# Patient Record
Sex: Female | Born: 1945 | Race: White | Hispanic: No | Smoking: Former smoker
Health system: Southern US, Community
[De-identification: ages and names within clinical notes are randomized; demographics above are authoritative.]

## PROBLEM LIST (undated history)

## (undated) DIAGNOSIS — G4730 Sleep apnea, unspecified: Secondary | ICD-10-CM

## (undated) DIAGNOSIS — M1990 Unspecified osteoarthritis, unspecified site: Secondary | ICD-10-CM

## (undated) DIAGNOSIS — K589 Irritable bowel syndrome without diarrhea: Secondary | ICD-10-CM

## (undated) DIAGNOSIS — M797 Fibromyalgia: Secondary | ICD-10-CM

## (undated) DIAGNOSIS — Z9889 Other specified postprocedural states: Secondary | ICD-10-CM

## (undated) DIAGNOSIS — T4145XA Adverse effect of unspecified anesthetic, initial encounter: Secondary | ICD-10-CM

## (undated) DIAGNOSIS — R112 Nausea with vomiting, unspecified: Secondary | ICD-10-CM

## (undated) DIAGNOSIS — M810 Age-related osteoporosis without current pathological fracture: Secondary | ICD-10-CM

## (undated) DIAGNOSIS — I10 Essential (primary) hypertension: Secondary | ICD-10-CM

## (undated) DIAGNOSIS — H269 Unspecified cataract: Secondary | ICD-10-CM

## (undated) DIAGNOSIS — K219 Gastro-esophageal reflux disease without esophagitis: Secondary | ICD-10-CM

## (undated) DIAGNOSIS — T8859XA Other complications of anesthesia, initial encounter: Secondary | ICD-10-CM

## (undated) HISTORY — DX: Irritable bowel syndrome, unspecified: K58.9

## (undated) HISTORY — DX: Fibromyalgia: M79.7

## (undated) HISTORY — DX: Unspecified cataract: H26.9

## (undated) HISTORY — DX: Gastro-esophageal reflux disease without esophagitis: K21.9

## (undated) HISTORY — PX: ABDOMINAL HYSTERECTOMY: SHX81

## (undated) HISTORY — PX: CARPAL TUNNEL RELEASE: SHX101

## (undated) HISTORY — PX: KNEE ARTHROSCOPY: SUR90

## (undated) HISTORY — DX: Age-related osteoporosis without current pathological fracture: M81.0

## (undated) HISTORY — PX: PLANTAR FASCIA SURGERY: SHX746

## (undated) HISTORY — PX: ROTATOR CUFF REPAIR: SHX139

## (undated) HISTORY — DX: Unspecified osteoarthritis, unspecified site: M19.90

## (undated) HISTORY — PX: REPLACEMENT TOTAL KNEE: SUR1224

---

## 2013-04-16 DIAGNOSIS — G4710 Hypersomnia, unspecified: Secondary | ICD-10-CM | POA: Diagnosis not present

## 2013-04-16 DIAGNOSIS — R5383 Other fatigue: Secondary | ICD-10-CM | POA: Diagnosis not present

## 2013-04-16 DIAGNOSIS — R5381 Other malaise: Secondary | ICD-10-CM | POA: Diagnosis not present

## 2013-04-21 DIAGNOSIS — G4730 Sleep apnea, unspecified: Secondary | ICD-10-CM | POA: Diagnosis not present

## 2013-04-21 DIAGNOSIS — G4710 Hypersomnia, unspecified: Secondary | ICD-10-CM | POA: Diagnosis not present

## 2013-04-22 DIAGNOSIS — I10 Essential (primary) hypertension: Secondary | ICD-10-CM | POA: Diagnosis not present

## 2013-04-22 DIAGNOSIS — I2510 Atherosclerotic heart disease of native coronary artery without angina pectoris: Secondary | ICD-10-CM | POA: Diagnosis not present

## 2013-04-22 DIAGNOSIS — IMO0001 Reserved for inherently not codable concepts without codable children: Secondary | ICD-10-CM | POA: Diagnosis not present

## 2013-04-22 DIAGNOSIS — E559 Vitamin D deficiency, unspecified: Secondary | ICD-10-CM | POA: Diagnosis not present

## 2013-04-22 DIAGNOSIS — E782 Mixed hyperlipidemia: Secondary | ICD-10-CM | POA: Diagnosis not present

## 2013-04-22 DIAGNOSIS — E119 Type 2 diabetes mellitus without complications: Secondary | ICD-10-CM | POA: Diagnosis not present

## 2013-04-22 DIAGNOSIS — E039 Hypothyroidism, unspecified: Secondary | ICD-10-CM | POA: Diagnosis not present

## 2013-04-22 DIAGNOSIS — S335XXA Sprain of ligaments of lumbar spine, initial encounter: Secondary | ICD-10-CM | POA: Diagnosis not present

## 2013-04-27 DIAGNOSIS — G4710 Hypersomnia, unspecified: Secondary | ICD-10-CM | POA: Diagnosis not present

## 2013-04-27 DIAGNOSIS — R5381 Other malaise: Secondary | ICD-10-CM | POA: Diagnosis not present

## 2013-04-27 DIAGNOSIS — G4730 Sleep apnea, unspecified: Secondary | ICD-10-CM | POA: Diagnosis not present

## 2013-05-28 DIAGNOSIS — E669 Obesity, unspecified: Secondary | ICD-10-CM | POA: Diagnosis not present

## 2013-05-28 DIAGNOSIS — I10 Essential (primary) hypertension: Secondary | ICD-10-CM | POA: Diagnosis not present

## 2013-05-28 DIAGNOSIS — IMO0001 Reserved for inherently not codable concepts without codable children: Secondary | ICD-10-CM | POA: Diagnosis not present

## 2013-06-15 DIAGNOSIS — G4710 Hypersomnia, unspecified: Secondary | ICD-10-CM | POA: Diagnosis not present

## 2013-06-15 DIAGNOSIS — R5383 Other fatigue: Secondary | ICD-10-CM | POA: Diagnosis not present

## 2013-06-15 DIAGNOSIS — R5381 Other malaise: Secondary | ICD-10-CM | POA: Diagnosis not present

## 2013-06-22 DIAGNOSIS — Z1231 Encounter for screening mammogram for malignant neoplasm of breast: Secondary | ICD-10-CM | POA: Diagnosis not present

## 2013-06-25 DIAGNOSIS — E669 Obesity, unspecified: Secondary | ICD-10-CM | POA: Diagnosis not present

## 2013-06-25 DIAGNOSIS — I10 Essential (primary) hypertension: Secondary | ICD-10-CM | POA: Diagnosis not present

## 2013-07-30 DIAGNOSIS — I10 Essential (primary) hypertension: Secondary | ICD-10-CM | POA: Diagnosis not present

## 2013-07-30 DIAGNOSIS — G4730 Sleep apnea, unspecified: Secondary | ICD-10-CM | POA: Diagnosis not present

## 2013-07-30 DIAGNOSIS — E669 Obesity, unspecified: Secondary | ICD-10-CM | POA: Diagnosis not present

## 2013-07-30 DIAGNOSIS — IMO0001 Reserved for inherently not codable concepts without codable children: Secondary | ICD-10-CM | POA: Diagnosis not present

## 2013-08-05 DIAGNOSIS — R5383 Other fatigue: Secondary | ICD-10-CM | POA: Diagnosis not present

## 2013-08-05 DIAGNOSIS — R5381 Other malaise: Secondary | ICD-10-CM | POA: Diagnosis not present

## 2013-08-05 DIAGNOSIS — G4710 Hypersomnia, unspecified: Secondary | ICD-10-CM | POA: Diagnosis not present

## 2013-08-05 DIAGNOSIS — G4730 Sleep apnea, unspecified: Secondary | ICD-10-CM | POA: Diagnosis not present

## 2013-08-27 DIAGNOSIS — I10 Essential (primary) hypertension: Secondary | ICD-10-CM | POA: Diagnosis not present

## 2013-08-27 DIAGNOSIS — E669 Obesity, unspecified: Secondary | ICD-10-CM | POA: Diagnosis not present

## 2013-08-27 DIAGNOSIS — IMO0001 Reserved for inherently not codable concepts without codable children: Secondary | ICD-10-CM | POA: Diagnosis not present

## 2013-08-27 DIAGNOSIS — S90569A Insect bite (nonvenomous), unspecified ankle, initial encounter: Secondary | ICD-10-CM | POA: Diagnosis not present

## 2013-08-27 DIAGNOSIS — W57XXXA Bitten or stung by nonvenomous insect and other nonvenomous arthropods, initial encounter: Secondary | ICD-10-CM | POA: Diagnosis not present

## 2013-10-19 DIAGNOSIS — F411 Generalized anxiety disorder: Secondary | ICD-10-CM | POA: Diagnosis not present

## 2013-11-23 DIAGNOSIS — R5381 Other malaise: Secondary | ICD-10-CM | POA: Diagnosis not present

## 2013-11-23 DIAGNOSIS — G4710 Hypersomnia, unspecified: Secondary | ICD-10-CM | POA: Diagnosis not present

## 2013-11-23 DIAGNOSIS — R5383 Other fatigue: Secondary | ICD-10-CM | POA: Diagnosis not present

## 2013-11-23 DIAGNOSIS — G4730 Sleep apnea, unspecified: Secondary | ICD-10-CM | POA: Diagnosis not present

## 2013-11-30 ENCOUNTER — Ambulatory Visit: Payer: Self-pay | Admitting: Podiatry

## 2013-12-03 ENCOUNTER — Ambulatory Visit (INDEPENDENT_AMBULATORY_CARE_PROVIDER_SITE_OTHER): Payer: Medicare Other

## 2013-12-03 VITALS — BP 152/81 | HR 94 | Resp 18

## 2013-12-03 DIAGNOSIS — M79609 Pain in unspecified limb: Secondary | ICD-10-CM

## 2013-12-03 DIAGNOSIS — IMO0001 Reserved for inherently not codable concepts without codable children: Secondary | ICD-10-CM

## 2013-12-03 DIAGNOSIS — G629 Polyneuropathy, unspecified: Secondary | ICD-10-CM

## 2013-12-03 DIAGNOSIS — B351 Tinea unguium: Secondary | ICD-10-CM

## 2013-12-03 DIAGNOSIS — M797 Fibromyalgia: Secondary | ICD-10-CM

## 2013-12-03 DIAGNOSIS — G609 Hereditary and idiopathic neuropathy, unspecified: Secondary | ICD-10-CM | POA: Diagnosis not present

## 2013-12-03 DIAGNOSIS — M79676 Pain in unspecified toe(s): Secondary | ICD-10-CM

## 2013-12-03 MED ORDER — TERBINAFINE HCL 250 MG PO TABS: 250.0000 mg | ORAL_TABLET | Freq: Every day | ORAL | Status: DC

## 2013-12-03 NOTE — Progress Notes (Signed)
   Subjective:    Patient ID: Alejandra Greene, female    DOB: Apr 16, 1945, 68 y.o.   MRN: [id number]  HPI I AM HAVING SOME TOENAIL ISSUES WITH MY TOES AND THERE ARE FUNGAL AND THE NAILS ARE LOOSE AND HAVE BEEN GOING ON FOR 2 YEARS AND THICK AND DISCOLORED AND MY FEET BURN AND THROBS AND CAN'T WEAR SHOES ALL DAY AND THEY DO GET NUMB AND TINGLE AND ARE SORE AND TENDER AND THEY DO GET COLD    Review of Systems  Constitutional: Positive for fatigue.  HENT: Positive for hearing loss.   Cardiovascular:       SWELLING   Musculoskeletal:       MUSCLE PAIN  Neurological: Positive for headaches.  Hematological: Bruises/bleeds easily.  All other systems reviewed and are negative.      Objective:   Physical Exam 68 year old white female well-developed well-nourished oriented x3 presents at this time with vital signs stable does have a long-standing history of burning stinging abnormal sensation her foot as well as fungus and thick deformed dystrophic nails more than 2 year history. B. burns and throbs he can't wear shoes. Does have history of fibromyalgia and peripheral neuropathy which is contributing to some of them symptomology R. does have concern about fungus the nails.  Lower extremity objective as follows vascular status is intact pedal pulses palpable DP pulse two over four bilateral PT one over 4 bilateral mild varicosities noted capillary refill time 3 seconds. Epicritic and proprioceptive sensations intact and symmetric bilateral normal plantar response and DTRs not elicited. 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 Current taking  Tranxene for that no other medications are being taken does have a history of taking Lyrica but could not tolerate that medication.         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 causing some of pruritus plan at this time nails are debrided 1 through 5 bilateral nail clippings are submitted from a deep debridement of nails for fungal culture and KOH patient is placed on a regimen of Lamisil therapy Lamisil 250 mg tablet circumventing all one daily for 90 days we'll do lab criptotic functions after the first 30 days to rule out any potential side effects and risk discontinue the [place] other symptoms or side effects were to occur we'll recheck in 3 months for followup and nail check and future palliative care patient request nails to be debridement this time as they're painful tender and symptomatic patient understands that treating those fungus will not resolve all the abnormal sensations and some of this may be neurologic or connective tissue disorder secondary to fibromyalgia. Recheck in 3 months  Harriet Masson DPM

## 2013-12-03 NOTE — Patient Instructions (Signed)
Onychomycosis/Fungal Toenails  WHAT IS IT? An infection that lies within the keratin of your nail plate that is caused by a fungus.  WHY ME? Fungal infections affect all ages, sexes, races, and creeds.  There may be many factors that predispose you to a fungal infection such as age, coexisting medical conditions such as diabetes, or an autoimmune disease; stress, medications, fatigue, genetics, etc.  Bottom line: fungus thrives in a warm, moist environment and your shoes offer such a location.  IS IT CONTAGIOUS? Theoretically, yes.  You do not want to share shoes, nail clippers or files with someone who has fungal toenails.  Walking around barefoot in the same room or sleeping in the same bed is unlikely to transfer the organism.  It is important to realize, however, that fungus can spread easily from one nail to the next on the same foot.  HOW DO WE TREAT THIS?  There are several ways to treat this condition.  Treatment may depend on many factors such as age, medications, pregnancy, liver and kidney conditions, etc.  It is best to ask your doctor which options are available to you.  1. No treatment.   Unlike many other medical concerns, you can live with this condition.  However for many people this can be a painful condition and may lead to ingrown toenails or a bacterial infection.  It is recommended that you keep the nails cut short to help reduce the amount of fungal nail. 2. Topical treatment.  These range from herbal remedies to prescription strength nail lacquers.  About 40-50% effective, topicals require twice daily application for approximately 9 to 12 months or until an entirely new nail has grown out.  The most effective topicals are medical grade medications available through physicians offices. 3. Oral antifungal medications.  With an 80-90% cure rate, the most common oral medication requires 3 to 4 months of therapy and stays in your system for a year as the new nail grows out.  Oral  antifungal medications do require blood work to make sure it is a safe drug for you.  A liver function panel will be performed prior to starting the medication and after the first month of treatment.  It is important to have the blood work performed to avoid any harmful side effects.  In general, this medication safe but blood work is required. 4. Laser Therapy.  This treatment is performed by applying a specialized laser to the affected nail plate.  This therapy is noninvasive, fast, and non-painful.  It is not covered by insurance and is therefore, out of pocket.  The results have been very good with a 80-95% cure rate.  [facility] is the only practice in the area to offer this therapy. 5. Permanent Nail Avulsion.  Removing the entire nail so that a new nail will not grow back.  Fill the prescription for terbinafine 250 mg tablets, generic for Lamisil. Take 2 tablets one daily for the next 90 days. Your prescriptions for 30 days with 2 refills. Discontinue if any side effects GI irritation stomach upset rash or any other symptoms cause difficulties. Next  Will need to have hepatic function/liver testing done at the end of the first month of treatment prior to refilling the second month. [facility] is dispensed for hepatic function panel to be done 1 month from now

## 2013-12-07 DIAGNOSIS — E669 Obesity, unspecified: Secondary | ICD-10-CM | POA: Diagnosis not present

## 2013-12-07 DIAGNOSIS — I10 Essential (primary) hypertension: Secondary | ICD-10-CM | POA: Diagnosis not present

## 2013-12-07 DIAGNOSIS — B351 Tinea unguium: Secondary | ICD-10-CM | POA: Diagnosis not present

## 2013-12-07 DIAGNOSIS — G4730 Sleep apnea, unspecified: Secondary | ICD-10-CM | POA: Diagnosis not present

## 2013-12-07 DIAGNOSIS — K769 Liver disease, unspecified: Secondary | ICD-10-CM | POA: Diagnosis not present

## 2013-12-07 NOTE — Addendum Note (Signed)
Addended by: Cranford Mon R on: 12/07/2013 02:22 PM   Modules accepted: Orders

## 2014-01-11 DIAGNOSIS — E559 Vitamin D deficiency, unspecified: Secondary | ICD-10-CM | POA: Diagnosis not present

## 2014-01-11 DIAGNOSIS — Z23 Encounter for immunization: Secondary | ICD-10-CM | POA: Diagnosis not present

## 2014-01-11 DIAGNOSIS — G4733 Obstructive sleep apnea (adult) (pediatric): Secondary | ICD-10-CM | POA: Diagnosis not present

## 2014-01-11 DIAGNOSIS — M797 Fibromyalgia: Secondary | ICD-10-CM | POA: Diagnosis not present

## 2014-01-11 DIAGNOSIS — I10 Essential (primary) hypertension: Secondary | ICD-10-CM | POA: Diagnosis not present

## 2014-01-11 DIAGNOSIS — E669 Obesity, unspecified: Secondary | ICD-10-CM | POA: Diagnosis not present

## 2014-02-10 DIAGNOSIS — M4803 Spinal stenosis, cervicothoracic region: Secondary | ICD-10-CM | POA: Diagnosis not present

## 2014-02-10 DIAGNOSIS — M159 Polyosteoarthritis, unspecified: Secondary | ICD-10-CM | POA: Diagnosis not present

## 2014-02-10 DIAGNOSIS — M25511 Pain in right shoulder: Secondary | ICD-10-CM | POA: Diagnosis not present

## 2014-02-10 DIAGNOSIS — I10 Essential (primary) hypertension: Secondary | ICD-10-CM | POA: Diagnosis not present

## 2014-02-10 DIAGNOSIS — Z23 Encounter for immunization: Secondary | ICD-10-CM | POA: Diagnosis not present

## 2014-02-10 DIAGNOSIS — M797 Fibromyalgia: Secondary | ICD-10-CM | POA: Diagnosis not present

## 2014-02-10 DIAGNOSIS — Z111 Encounter for screening for respiratory tuberculosis: Secondary | ICD-10-CM | POA: Diagnosis not present

## 2014-03-11 ENCOUNTER — Ambulatory Visit (INDEPENDENT_AMBULATORY_CARE_PROVIDER_SITE_OTHER): Payer: Medicare Other

## 2014-03-11 VITALS — BP 147/80 | HR 84 | Resp 12

## 2014-03-11 DIAGNOSIS — M79676 Pain in unspecified toe(s): Secondary | ICD-10-CM | POA: Diagnosis not present

## 2014-03-11 DIAGNOSIS — M797 Fibromyalgia: Secondary | ICD-10-CM

## 2014-03-11 DIAGNOSIS — B351 Tinea unguium: Secondary | ICD-10-CM

## 2014-03-11 DIAGNOSIS — G629 Polyneuropathy, unspecified: Secondary | ICD-10-CM | POA: Diagnosis not present

## 2014-03-11 NOTE — Patient Instructions (Signed)

## 2014-03-11 NOTE — Progress Notes (Signed)
   Subjective:    Patient ID: Alejandra Greene, female    DOB: [birth year], 68 y.o.   MRN: [id number]  HPI  ''TOENAILS ARE LOOKING A LITTLE BETTER.''  Review of Systems no new findings or systemic changes noted    Objective:   Physical Exam neurovascular status is intact and unchanged pedal pulses are palpable DP +2 PT plus one over 4 Refill time 3 seconds patient been applying her taking the topical slush or oral antifungal Lamisil as an her third month of taking at this time there is some proximal clearing RE starting to occur no pain no discomfort no other complications with the medication noted nails thick brittle [place] friable dystrophic and criptotic 1 through 5 bilateral although previous nail excision partial excision of the left hallux is noted since she nails 9. At this time no other new changes are noted no open wounds no ulcers no secondary infections      Assessment & Plan:  Assessment onychomycosis painful mycotic nails debrided 9 continue with complete the Lamisil regimen as one month left to complete contact us any change difficulties into the interim. Recheck in 3 months for long-term follow-up at 6 or 9 month interval may consider booster of Lamisil if not completely resolving however there is proximal clearing showing at this point of the nails which are thick brittle crumbly yellow friable reappointed 3 months for an mycotic nail care and debridement next  Harriet Masson DPM

## 2014-06-10 ENCOUNTER — Ambulatory Visit (INDEPENDENT_AMBULATORY_CARE_PROVIDER_SITE_OTHER): Payer: Medicare Other

## 2014-06-10 VITALS — BP 139/76 | HR 88 | Resp 18

## 2014-06-10 DIAGNOSIS — G629 Polyneuropathy, unspecified: Secondary | ICD-10-CM

## 2014-06-10 DIAGNOSIS — M797 Fibromyalgia: Secondary | ICD-10-CM

## 2014-06-10 DIAGNOSIS — M79676 Pain in unspecified toe(s): Secondary | ICD-10-CM

## 2014-06-10 DIAGNOSIS — B351 Tinea unguium: Secondary | ICD-10-CM

## 2014-06-10 NOTE — Progress Notes (Signed)
   Subjective:    Patient ID: Alejandra Greene, female    DOB: 04-01-1946, 69 y.o.   MRN: [id number]  HPI i am here to get my toenails trimmed up and check feet    Review of Systems no new findings or systemic changes noted     Objective:   Physical Exam Neurovascular status is unchanged pedal pulses DP +2 PT 1 over 4 bilateral capillary refill time 3 seconds. Patient does have history of fibromyalgia in pain symptoms has thick brittle crumbly dystrophic nails 1 through 5 bilateral with keratosis and incurvation. No open wounds no ulcers no secondary infection is noted       Assessment & Plan:  Assessment this time is onychomycosis with friable discolored painful nails 1 through 5 bilateral debrided at this time. Patient is completed her Lamisil rest regimen continue monitor over the next 3 or months as needed for palliative care  Harriet Masson DPM

## 2014-06-23 DIAGNOSIS — R5383 Other fatigue: Secondary | ICD-10-CM | POA: Diagnosis not present

## 2014-06-23 DIAGNOSIS — G4733 Obstructive sleep apnea (adult) (pediatric): Secondary | ICD-10-CM | POA: Diagnosis not present

## 2014-06-24 DIAGNOSIS — G4733 Obstructive sleep apnea (adult) (pediatric): Secondary | ICD-10-CM | POA: Diagnosis not present

## 2014-06-28 DIAGNOSIS — Z1231 Encounter for screening mammogram for malignant neoplasm of breast: Secondary | ICD-10-CM | POA: Diagnosis not present

## 2014-07-23 DIAGNOSIS — I10 Essential (primary) hypertension: Secondary | ICD-10-CM | POA: Diagnosis not present

## 2014-07-23 DIAGNOSIS — G629 Polyneuropathy, unspecified: Secondary | ICD-10-CM | POA: Diagnosis not present

## 2014-07-23 DIAGNOSIS — K219 Gastro-esophageal reflux disease without esophagitis: Secondary | ICD-10-CM | POA: Diagnosis not present

## 2014-07-23 DIAGNOSIS — M797 Fibromyalgia: Secondary | ICD-10-CM | POA: Diagnosis not present

## 2014-09-09 ENCOUNTER — Ambulatory Visit: Payer: Medicare Other

## 2014-09-16 DIAGNOSIS — K580 Irritable bowel syndrome with diarrhea: Secondary | ICD-10-CM | POA: Diagnosis not present

## 2014-09-16 DIAGNOSIS — Z1211 Encounter for screening for malignant neoplasm of colon: Secondary | ICD-10-CM | POA: Diagnosis not present

## 2014-09-16 DIAGNOSIS — Z800 Family history of malignant neoplasm of digestive organs: Secondary | ICD-10-CM | POA: Diagnosis not present

## 2014-09-20 ENCOUNTER — Ambulatory Visit (INDEPENDENT_AMBULATORY_CARE_PROVIDER_SITE_OTHER): Payer: Medicare Other | Admitting: Podiatry

## 2014-09-20 ENCOUNTER — Ambulatory Visit: Payer: Medicare Other | Admitting: Podiatrist

## 2014-09-20 ENCOUNTER — Encounter: Payer: Self-pay | Admitting: Podiatry

## 2014-09-20 VITALS — BP 122/65 | HR 87 | Resp 18

## 2014-09-20 DIAGNOSIS — G629 Polyneuropathy, unspecified: Secondary | ICD-10-CM

## 2014-09-20 DIAGNOSIS — B351 Tinea unguium: Secondary | ICD-10-CM | POA: Diagnosis not present

## 2014-09-20 DIAGNOSIS — M79676 Pain in unspecified toe(s): Secondary | ICD-10-CM

## 2014-09-20 NOTE — Progress Notes (Signed)
Patient ID: Alejandra Greene, female   DOB: [birth year], 69 y.o.   MRN: [id number] Complaint:  Visit Type: Patient returns to my office for continued preventative foot care services. Complaint: Patient states" my nails have grown long and thick and become painful to walk and wear shoes" . She presents for preventative foot care services. No changes to ROS  Podiatric Exam: Vascular: dorsalis pedis and posterior tibial pulses are palpable bilateral. Capillary return is immediate. Temperature gradient is WNL. Skin turgor WNL  Sensorium: Normal Semmes Weinstein monofilament test. Normal tactile sensation bilaterally. Nail Exam: Pt has thick disfigured discolored nails with subungual debris noted bilateral entire nail hallux through fifth toenails Ulcer Exam: There is no evidence of ulcer or pre-ulcerative changes or infection. Orthopedic Exam: Muscle tone and strength are WNL. No limitations in general ROM. No crepitus or effusions noted. Foot type and digits show no abnormalities. Bony prominences are unremarkable. Skin: No Porokeratosis. No infection or ulcers  Diagnosis:  Tinea unguium, Pain in right toe, pain in left toes  Treatment & Plan Procedures and Treatment: Consent by patient was obtained for treatment procedures. The patient understood the discussion of treatment and procedures well. All questions were answered thoroughly reviewed. Debridement of mycotic and hypertrophic toenails, 1 through 5 bilateral and clearing of subungual debris. No ulceration, no infection noted.  Return Visit-Office Procedure: Patient instructed to return to the office for a follow up visit 3 months for continued evaluation and treatment.

## 2014-10-21 DIAGNOSIS — D122 Benign neoplasm of ascending colon: Secondary | ICD-10-CM | POA: Diagnosis not present

## 2014-10-21 DIAGNOSIS — I10 Essential (primary) hypertension: Secondary | ICD-10-CM | POA: Diagnosis not present

## 2014-10-21 DIAGNOSIS — K219 Gastro-esophageal reflux disease without esophagitis: Secondary | ICD-10-CM | POA: Diagnosis not present

## 2014-10-21 DIAGNOSIS — K589 Irritable bowel syndrome without diarrhea: Secondary | ICD-10-CM | POA: Diagnosis not present

## 2014-10-21 DIAGNOSIS — G4733 Obstructive sleep apnea (adult) (pediatric): Secondary | ICD-10-CM | POA: Diagnosis not present

## 2014-10-21 DIAGNOSIS — K5730 Diverticulosis of large intestine without perforation or abscess without bleeding: Secondary | ICD-10-CM | POA: Diagnosis not present

## 2014-10-21 DIAGNOSIS — K648 Other hemorrhoids: Secondary | ICD-10-CM | POA: Diagnosis not present

## 2014-10-21 DIAGNOSIS — M797 Fibromyalgia: Secondary | ICD-10-CM | POA: Diagnosis not present

## 2014-10-21 DIAGNOSIS — M1990 Unspecified osteoarthritis, unspecified site: Secondary | ICD-10-CM | POA: Diagnosis not present

## 2014-10-21 DIAGNOSIS — D120 Benign neoplasm of cecum: Secondary | ICD-10-CM | POA: Diagnosis not present

## 2014-10-21 DIAGNOSIS — Z800 Family history of malignant neoplasm of digestive organs: Secondary | ICD-10-CM | POA: Diagnosis not present

## 2014-10-21 DIAGNOSIS — Z1211 Encounter for screening for malignant neoplasm of colon: Secondary | ICD-10-CM | POA: Diagnosis not present

## 2014-11-04 DIAGNOSIS — K219 Gastro-esophageal reflux disease without esophagitis: Secondary | ICD-10-CM | POA: Diagnosis not present

## 2014-11-04 DIAGNOSIS — F4322 Adjustment disorder with anxiety: Secondary | ICD-10-CM | POA: Diagnosis not present

## 2014-11-04 DIAGNOSIS — E663 Overweight: Secondary | ICD-10-CM | POA: Diagnosis not present

## 2014-11-19 DIAGNOSIS — H6691 Otitis media, unspecified, right ear: Secondary | ICD-10-CM | POA: Diagnosis not present

## 2014-12-22 ENCOUNTER — Encounter: Payer: Self-pay | Admitting: Podiatry

## 2014-12-22 ENCOUNTER — Ambulatory Visit (INDEPENDENT_AMBULATORY_CARE_PROVIDER_SITE_OTHER): Payer: Medicare Other

## 2014-12-22 ENCOUNTER — Ambulatory Visit (INDEPENDENT_AMBULATORY_CARE_PROVIDER_SITE_OTHER): Payer: Medicare Other | Admitting: Podiatry

## 2014-12-22 VITALS — BP 119/66 | HR 83 | Resp 16

## 2014-12-22 DIAGNOSIS — M79672 Pain in left foot: Secondary | ICD-10-CM

## 2014-12-22 DIAGNOSIS — M7752 Other enthesopathy of left foot: Secondary | ICD-10-CM

## 2014-12-22 DIAGNOSIS — B351 Tinea unguium: Secondary | ICD-10-CM | POA: Diagnosis not present

## 2014-12-22 DIAGNOSIS — M7672 Peroneal tendinitis, left leg: Secondary | ICD-10-CM

## 2014-12-22 MED ORDER — MELOXICAM 15 MG PO TABS: 15.0000 mg | ORAL_TABLET | Freq: Every day | ORAL | Status: DC

## 2014-12-22 NOTE — Progress Notes (Signed)
Patient ID: Alejandra Greene, female   DOB: 10/23/1945, 69 y.o.   MRN: [id number] Complaint:  Visit Type: Patient returns to my office for continued preventative foot care services. Complaint: Patient states" my nails have grown long and thick and become painful to walk and wear shoes" . She presents for preventative foot care services. No changes to ROS.  She says her mother died last week and she has been walking.  She has developed a red swollen painful area in the outside of her left foot.  No self treatment provided.  Podiatric Exam: Vascular: dorsalis pedis and posterior tibial pulses are palpable bilateral. Capillary return is immediate. Temperature gradient is WNL. Skin turgor WNL  Sensorium: Normal Semmes Weinstein monofilament test. Normal tactile sensation bilaterally. Nail Exam: Pt has thick disfigured discolored nails with subungual debris noted bilateral entire nail hallux through fifth toenails Ulcer Exam: There is no evidence of ulcer or pre-ulcerative changes or infection. Orthopedic Exam: Muscle tone and strength are WNL. No limitations in general ROM. No crepitus or effusions noted. Foot type and digits show no abnormalities. Bony prominences are unremarkable. Red swollen painful area at insertion peroneal tendon left foot.  Muscle power WNL. Skin: No Porokeratosis. No infection or ulcers  Diagnosis:  Tinea unguium, Pain in right toe, pain in left toes, Peroneal tendinitis  Treatment & Plan Procedures and Treatment: Consent by patient was obtained for treatment procedures. The patient understood the discussion of treatment and procedures well. All questions were answered thoroughly reviewed. Debridement of mycotic and hypertrophic toenails, 1 through 5 bilateral and clearing of subungual debris. No ulceration, no infection noted. X-rays taken left foot.  Evidence of midfoot arthritis but no bony pathology base fifth metabase left foot. Prescribed Mobic. Return Visit-Office Procedure:  Patient instructed to return to the office for a follow up visit 3 months for continued evaluation and treatment.

## 2014-12-27 DIAGNOSIS — G4733 Obstructive sleep apnea (adult) (pediatric): Secondary | ICD-10-CM | POA: Diagnosis not present

## 2014-12-27 DIAGNOSIS — R5383 Other fatigue: Secondary | ICD-10-CM | POA: Diagnosis not present

## 2015-01-24 DIAGNOSIS — Z23 Encounter for immunization: Secondary | ICD-10-CM | POA: Diagnosis not present

## 2015-02-07 DIAGNOSIS — Z23 Encounter for immunization: Secondary | ICD-10-CM | POA: Diagnosis not present

## 2015-02-16 DIAGNOSIS — Z13820 Encounter for screening for osteoporosis: Secondary | ICD-10-CM | POA: Diagnosis not present

## 2015-02-16 DIAGNOSIS — L2389 Allergic contact dermatitis due to other agents: Secondary | ICD-10-CM | POA: Diagnosis not present

## 2015-02-16 DIAGNOSIS — F4322 Adjustment disorder with anxiety: Secondary | ICD-10-CM | POA: Diagnosis not present

## 2015-02-16 DIAGNOSIS — M898X9 Other specified disorders of bone, unspecified site: Secondary | ICD-10-CM | POA: Diagnosis not present

## 2015-02-16 DIAGNOSIS — Z0000 Encounter for general adult medical examination without abnormal findings: Secondary | ICD-10-CM | POA: Diagnosis not present

## 2015-02-16 DIAGNOSIS — M859 Disorder of bone density and structure, unspecified: Secondary | ICD-10-CM | POA: Diagnosis not present

## 2015-02-16 DIAGNOSIS — K219 Gastro-esophageal reflux disease without esophagitis: Secondary | ICD-10-CM | POA: Diagnosis not present

## 2015-02-16 DIAGNOSIS — M5136 Other intervertebral disc degeneration, lumbar region: Secondary | ICD-10-CM | POA: Diagnosis not present

## 2015-03-23 ENCOUNTER — Ambulatory Visit: Payer: Medicare Other | Admitting: Sports Medicine

## 2015-03-23 ENCOUNTER — Ambulatory Visit: Payer: Medicare Other | Admitting: Podiatry

## 2015-03-24 ENCOUNTER — Ambulatory Visit: Payer: Medicare Other | Admitting: Sports Medicine

## 2015-03-28 ENCOUNTER — Ambulatory Visit (INDEPENDENT_AMBULATORY_CARE_PROVIDER_SITE_OTHER): Payer: Medicare Other | Admitting: Podiatry

## 2015-03-28 ENCOUNTER — Encounter: Payer: Self-pay | Admitting: Podiatry

## 2015-03-28 DIAGNOSIS — M7752 Other enthesopathy of left foot: Secondary | ICD-10-CM

## 2015-03-28 DIAGNOSIS — M79676 Pain in unspecified toe(s): Secondary | ICD-10-CM

## 2015-03-28 DIAGNOSIS — M7672 Peroneal tendinitis, left leg: Secondary | ICD-10-CM

## 2015-03-28 DIAGNOSIS — B351 Tinea unguium: Secondary | ICD-10-CM

## 2015-03-28 NOTE — Progress Notes (Signed)
Patient ID: Alejandra Greene, female   DOB: 03-Aug-1945, 69 y.o.   MRN: [id number] Complaint:  Visit Type: Patient returns to my office for continued preventative foot care services. Complaint: Patient states" my nails have grown long and thick and become painful to walk and wear shoes" . She presents for preventative foot care services. No changes to ROS.  She says she continues to experience pain on the outside of her left foot.  Mobic was ineffective. She desires to be reevaluated for the pain.   Podiatric Exam: Vascular: dorsalis pedis and posterior tibial pulses are palpable bilateral. Capillary return is immediate. Temperature gradient is WNL. Skin turgor WNL  Sensorium: Normal Semmes Weinstein monofilament test. Normal tactile sensation bilaterally. Nail Exam: Pt has thick disfigured discolored nails with subungual debris noted bilateral entire nail hallux through fifth toenails Ulcer Exam: There is no evidence of ulcer or pre-ulcerative changes or infection. Orthopedic Exam: Muscle tone and strength are WNL. No limitations in general ROM. No crepitus or effusions noted. Foot type and digits show no abnormalities. Bony prominences are unremarkable.  Palpable pain at the cuboid-metabase left foot.  Swelling and pain persist. Skin: No Porokeratosis. No infection or ulcers  Diagnosis:  Tinea unguium, Pain in right toe, pain in left toes.  Foot Sprain  Treatment & Plan Procedures and Treatment: Consent by patient was obtained for treatment procedures. The patient understood the discussion of treatment and procedures well. All questions were answered thoroughly reviewed. Debridement of mycotic and hypertrophic toenails, 1 through 5 bilateral and clearing of subungual debris. No ulceration, no infection noted.  Injection therapy and powersteps recommended. Return Visit-Office Procedure: Patient instructed to return to the office for a follow up visit 3 months for continued evaluation and  treatment.  Gardiner Barefoot DPM

## 2015-04-08 DIAGNOSIS — K219 Gastro-esophageal reflux disease without esophagitis: Secondary | ICD-10-CM | POA: Diagnosis not present

## 2015-04-08 DIAGNOSIS — M150 Primary generalized (osteo)arthritis: Secondary | ICD-10-CM | POA: Diagnosis not present

## 2015-04-08 DIAGNOSIS — I10 Essential (primary) hypertension: Secondary | ICD-10-CM | POA: Diagnosis not present

## 2015-04-08 DIAGNOSIS — K580 Irritable bowel syndrome with diarrhea: Secondary | ICD-10-CM | POA: Diagnosis not present

## 2015-04-08 DIAGNOSIS — M791 Myalgia: Secondary | ICD-10-CM | POA: Diagnosis not present

## 2015-04-08 DIAGNOSIS — E559 Vitamin D deficiency, unspecified: Secondary | ICD-10-CM | POA: Diagnosis not present

## 2015-04-08 DIAGNOSIS — G603 Idiopathic progressive neuropathy: Secondary | ICD-10-CM | POA: Diagnosis not present

## 2015-04-08 DIAGNOSIS — G4733 Obstructive sleep apnea (adult) (pediatric): Secondary | ICD-10-CM | POA: Diagnosis not present

## 2015-04-08 DIAGNOSIS — M545 Low back pain: Secondary | ICD-10-CM | POA: Diagnosis not present

## 2015-04-10 HISTORY — PX: COLONOSCOPY: SHX174

## 2015-04-14 DIAGNOSIS — I10 Essential (primary) hypertension: Secondary | ICD-10-CM | POA: Diagnosis not present

## 2015-04-18 ENCOUNTER — Ambulatory Visit: Payer: Medicare Other | Admitting: Podiatry

## 2015-04-25 ENCOUNTER — Encounter: Payer: Self-pay | Admitting: Podiatry

## 2015-04-25 ENCOUNTER — Ambulatory Visit (INDEPENDENT_AMBULATORY_CARE_PROVIDER_SITE_OTHER): Payer: Medicare Other | Admitting: Podiatry

## 2015-04-25 VITALS — BP 120/74 | HR 90 | Resp 16

## 2015-04-25 DIAGNOSIS — M79672 Pain in left foot: Secondary | ICD-10-CM | POA: Diagnosis not present

## 2015-04-25 DIAGNOSIS — M7752 Other enthesopathy of left foot: Secondary | ICD-10-CM | POA: Diagnosis not present

## 2015-04-25 DIAGNOSIS — M7672 Peroneal tendinitis, left leg: Secondary | ICD-10-CM

## 2015-04-25 NOTE — Progress Notes (Signed)
Subjective:     Patient ID: Alejandra Greene, female   DOB: 03/11/1946, 70 y.o.   MRN: [id number]  HPI this patient returns to the office follow-up for diagnosis of a cuboid syndrome, left foot. She states that she has been wearing her orthotics all day long since they have helped tremendously. She states that she is 100% better between the injection and the insoles. She is very pleased with her progress   Review of Systems     Objective:   Physical Exam GENERAL APPEARANCE: Alert, conversant. Appropriately groomed. No acute distress.  VASCULAR: Pedal pulses palpable at  [facility] and PT bilateral.  Capillary refill time is immediate to all digits,  Normal temperature gradient.  Digital hair growth is present bilateral  NEUROLOGIC: sensation is normal to 5.07 monofilament at 5/5 sites bilateral.  Light touch is intact bilateral, Muscle strength normal.  MUSCULOSKELETAL: acceptable muscle strength, tone and stability bilateral.  Intrinsic muscluature intact bilateral.  Rectus appearance of foot and digits noted bilateral. No pain or swelling at the cuboid joint right foot.  DERMATOLOGIC: skin color, texture, and turgor are within normal limits.  No preulcerative lesions or ulcers  are seen, no interdigital maceration noted.  No open lesions present.  Digital nails are asymptomatic. No drainage noted.     Assessment:     Foot sprain     Plan:     ROV  RTC 8 weeks.   Gardiner Barefoot DPM

## 2015-06-13 DIAGNOSIS — G4733 Obstructive sleep apnea (adult) (pediatric): Secondary | ICD-10-CM | POA: Diagnosis not present

## 2015-06-13 DIAGNOSIS — R5383 Other fatigue: Secondary | ICD-10-CM | POA: Diagnosis not present

## 2015-06-20 ENCOUNTER — Ambulatory Visit: Payer: Medicare Other | Admitting: Podiatry

## 2015-06-27 ENCOUNTER — Encounter: Payer: Self-pay | Admitting: Podiatry

## 2015-06-27 ENCOUNTER — Ambulatory Visit (INDEPENDENT_AMBULATORY_CARE_PROVIDER_SITE_OTHER): Payer: Medicare Other | Admitting: Podiatry

## 2015-06-27 DIAGNOSIS — B351 Tinea unguium: Secondary | ICD-10-CM | POA: Diagnosis not present

## 2015-06-27 DIAGNOSIS — M79676 Pain in unspecified toe(s): Secondary | ICD-10-CM | POA: Diagnosis not present

## 2015-06-27 NOTE — Progress Notes (Signed)
Patient ID: Alejandra Greene, female   DOB: October 07, 1945, 70 y.o.   MRN: [id number] Complaint:  Visit Type: Patient returns to my office for continued preventative foot care services. Complaint: Patient states" my nails have grown long and thick and become painful to walk and wear shoes" . She presents for preventative foot care services. No changes to ROS.     Podiatric Exam: Vascular: dorsalis pedis and posterior tibial pulses are palpable bilateral. Capillary return is immediate. Temperature gradient is WNL. Skin turgor WNL  Sensorium: Normal Semmes Weinstein monofilament test. Normal tactile sensation bilaterally. Nail Exam: Pt has thick disfigured discolored nails with subungual debris noted bilateral entire nail hallux through fifth toenails Ulcer Exam: There is no evidence of ulcer or pre-ulcerative changes or infection. Orthopedic Exam: Muscle tone and strength are WNL. No limitations in general ROM. No crepitus or effusions noted. Foot type and digits show no abnormalities. Bony prominences are unremarkable.  Skin: No Porokeratosis. No infection or ulcers  Diagnosis:  Tinea unguium, Pain in right toe, pain in left toes.   Treatment & Plan Procedures and Treatment: Consent by patient was obtained for treatment procedures. The patient understood the discussion of treatment and procedures well. All questions were answered thoroughly reviewed. Debridement of mycotic and hypertrophic toenails, 1 through 5 bilateral and clearing of subungual debris. No ulceration, no infection. Return Visit-Office Procedure: Patient instructed to return to the office for a follow up visit 4 months for continued evaluation and treatment.  Gardiner Barefoot DPM

## 2015-06-30 DIAGNOSIS — G603 Idiopathic progressive neuropathy: Secondary | ICD-10-CM | POA: Diagnosis not present

## 2015-06-30 DIAGNOSIS — K219 Gastro-esophageal reflux disease without esophagitis: Secondary | ICD-10-CM | POA: Diagnosis not present

## 2015-06-30 DIAGNOSIS — Z1231 Encounter for screening mammogram for malignant neoplasm of breast: Secondary | ICD-10-CM | POA: Diagnosis not present

## 2015-06-30 DIAGNOSIS — G4733 Obstructive sleep apnea (adult) (pediatric): Secondary | ICD-10-CM | POA: Diagnosis not present

## 2015-06-30 DIAGNOSIS — Z13820 Encounter for screening for osteoporosis: Secondary | ICD-10-CM | POA: Diagnosis not present

## 2015-06-30 DIAGNOSIS — I10 Essential (primary) hypertension: Secondary | ICD-10-CM | POA: Diagnosis not present

## 2015-06-30 DIAGNOSIS — M545 Low back pain: Secondary | ICD-10-CM | POA: Diagnosis not present

## 2015-06-30 DIAGNOSIS — M5442 Lumbago with sciatica, left side: Secondary | ICD-10-CM | POA: Diagnosis not present

## 2015-06-30 DIAGNOSIS — M797 Fibromyalgia: Secondary | ICD-10-CM | POA: Diagnosis not present

## 2015-06-30 DIAGNOSIS — F411 Generalized anxiety disorder: Secondary | ICD-10-CM | POA: Diagnosis not present

## 2015-06-30 DIAGNOSIS — M150 Primary generalized (osteo)arthritis: Secondary | ICD-10-CM | POA: Diagnosis not present

## 2015-07-02 DIAGNOSIS — G4733 Obstructive sleep apnea (adult) (pediatric): Secondary | ICD-10-CM | POA: Diagnosis not present

## 2015-07-11 DIAGNOSIS — Z780 Asymptomatic menopausal state: Secondary | ICD-10-CM | POA: Diagnosis not present

## 2015-07-11 DIAGNOSIS — Z1231 Encounter for screening mammogram for malignant neoplasm of breast: Secondary | ICD-10-CM | POA: Diagnosis not present

## 2015-07-11 DIAGNOSIS — M810 Age-related osteoporosis without current pathological fracture: Secondary | ICD-10-CM | POA: Diagnosis not present

## 2015-07-13 DIAGNOSIS — G4733 Obstructive sleep apnea (adult) (pediatric): Secondary | ICD-10-CM | POA: Diagnosis not present

## 2015-07-14 DIAGNOSIS — R5383 Other fatigue: Secondary | ICD-10-CM | POA: Diagnosis not present

## 2015-07-14 DIAGNOSIS — G4733 Obstructive sleep apnea (adult) (pediatric): Secondary | ICD-10-CM | POA: Diagnosis not present

## 2015-07-23 DIAGNOSIS — S8990XA Unspecified injury of unspecified lower leg, initial encounter: Secondary | ICD-10-CM | POA: Diagnosis not present

## 2015-07-26 DIAGNOSIS — S8990XD Unspecified injury of unspecified lower leg, subsequent encounter: Secondary | ICD-10-CM | POA: Diagnosis not present

## 2015-08-08 DIAGNOSIS — S92302A Fracture of unspecified metatarsal bone(s), left foot, initial encounter for closed fracture: Secondary | ICD-10-CM | POA: Diagnosis not present

## 2015-08-09 DIAGNOSIS — S91332A Puncture wound without foreign body, left foot, initial encounter: Secondary | ICD-10-CM | POA: Diagnosis not present

## 2015-08-16 DIAGNOSIS — S91332D Puncture wound without foreign body, left foot, subsequent encounter: Secondary | ICD-10-CM | POA: Diagnosis not present

## 2015-09-13 DIAGNOSIS — G4733 Obstructive sleep apnea (adult) (pediatric): Secondary | ICD-10-CM | POA: Diagnosis not present

## 2015-09-13 DIAGNOSIS — R5383 Other fatigue: Secondary | ICD-10-CM | POA: Diagnosis not present

## 2015-09-14 DIAGNOSIS — G4733 Obstructive sleep apnea (adult) (pediatric): Secondary | ICD-10-CM | POA: Diagnosis not present

## 2015-10-04 DIAGNOSIS — M545 Low back pain: Secondary | ICD-10-CM | POA: Diagnosis not present

## 2015-10-04 DIAGNOSIS — M797 Fibromyalgia: Secondary | ICD-10-CM | POA: Diagnosis not present

## 2015-10-04 DIAGNOSIS — I10 Essential (primary) hypertension: Secondary | ICD-10-CM | POA: Diagnosis not present

## 2015-10-04 DIAGNOSIS — G4733 Obstructive sleep apnea (adult) (pediatric): Secondary | ICD-10-CM | POA: Diagnosis not present

## 2015-10-04 DIAGNOSIS — F411 Generalized anxiety disorder: Secondary | ICD-10-CM | POA: Diagnosis not present

## 2015-10-04 DIAGNOSIS — G603 Idiopathic progressive neuropathy: Secondary | ICD-10-CM | POA: Diagnosis not present

## 2015-10-04 DIAGNOSIS — K219 Gastro-esophageal reflux disease without esophagitis: Secondary | ICD-10-CM | POA: Diagnosis not present

## 2015-10-19 DIAGNOSIS — I10 Essential (primary) hypertension: Secondary | ICD-10-CM | POA: Diagnosis not present

## 2015-10-31 ENCOUNTER — Ambulatory Visit (INDEPENDENT_AMBULATORY_CARE_PROVIDER_SITE_OTHER): Payer: Medicare Other | Admitting: Podiatry

## 2015-10-31 ENCOUNTER — Encounter: Payer: Self-pay | Admitting: Podiatry

## 2015-10-31 DIAGNOSIS — M79676 Pain in unspecified toe(s): Secondary | ICD-10-CM

## 2015-10-31 DIAGNOSIS — B351 Tinea unguium: Secondary | ICD-10-CM | POA: Diagnosis not present

## 2015-10-31 NOTE — Progress Notes (Signed)
Patient ID: Alejandra Greene, female   DOB: [birth year], 70 y.o.   MRN: [id number] Complaint:  Visit Type: Patient returns to my office for continued preventative foot care services. Complaint: Patient states" my nails have grown long and thick and become painful to walk and wear shoes" . She presents for preventative foot care services. No changes to ROS. She admits to dropping a drill on her right foot months ago and it is starting to become sore.       Podiatric Exam: Vascular: dorsalis pedis and posterior tibial pulses are palpable bilateral. Capillary return is immediate. Temperature gradient is WNL. Skin turgor WNL  Sensorium: Normal Semmes Weinstein monofilament test. Normal tactile sensation bilaterally. Nail Exam: Pt has thick disfigured discolored nails with subungual debris noted bilateral entire nail hallux through fifth toenails Ulcer Exam: There is no evidence of ulcer or pre-ulcerative changes or infection. Orthopedic Exam: Muscle tone and strength are WNL. No limitations in general ROM. No crepitus or effusions noted. Foot type and digits show no abnormalities. Bony prominences are unremarkable. Palpable pain 4th metatarsal left foot. Skin: No Porokeratosis. No infection or ulcers  Diagnosis:  Tinea unguium, Pain in right toe, pain in left toes.   Treatment & Plan Procedures and Treatment: Consent by patient was obtained for treatment procedures. The patient understood the discussion of treatment and procedures well. All questions were answered thoroughly reviewed. Debridement of mycotic and hypertrophic toenails, 1 through 5 bilateral and clearing of subungual debris. No ulceration, no infection. Told her to return to Dr. Noemi Chapel for reevaluation left foot. Return Visit-Office Procedure: Patient instructed to return to the office for a follow up visit 4 months for continued evaluation and treatment.  Gardiner Barefoot DPM

## 2015-12-05 DIAGNOSIS — M84375A Stress fracture, left foot, initial encounter for fracture: Secondary | ICD-10-CM | POA: Diagnosis not present

## 2015-12-14 DIAGNOSIS — Z0000 Encounter for general adult medical examination without abnormal findings: Secondary | ICD-10-CM | POA: Diagnosis not present

## 2015-12-14 DIAGNOSIS — Z6835 Body mass index (BMI) 35.0-35.9, adult: Secondary | ICD-10-CM | POA: Diagnosis not present

## 2015-12-14 DIAGNOSIS — Z23 Encounter for immunization: Secondary | ICD-10-CM | POA: Diagnosis not present

## 2015-12-19 DIAGNOSIS — Z1211 Encounter for screening for malignant neoplasm of colon: Secondary | ICD-10-CM | POA: Diagnosis not present

## 2016-01-25 DIAGNOSIS — Z111 Encounter for screening for respiratory tuberculosis: Secondary | ICD-10-CM | POA: Diagnosis not present

## 2016-02-06 ENCOUNTER — Encounter: Payer: Self-pay | Admitting: Podiatry

## 2016-02-06 ENCOUNTER — Ambulatory Visit (INDEPENDENT_AMBULATORY_CARE_PROVIDER_SITE_OTHER): Payer: Medicare Other | Admitting: Podiatry

## 2016-02-06 VITALS — Resp 14 | Ht 63.0 in | Wt 198.0 lb

## 2016-02-06 DIAGNOSIS — M79676 Pain in unspecified toe(s): Secondary | ICD-10-CM

## 2016-02-06 DIAGNOSIS — B351 Tinea unguium: Secondary | ICD-10-CM

## 2016-02-06 NOTE — Progress Notes (Signed)
Patient ID: Alejandra Greene, female   DOB: 01-08-1946, 70 y.o.   MRN: [id number] Complaint:  Visit Type: Patient returns to my office for continued preventative foot care services. Complaint: Patient states" my nails have grown long and thick and become painful to walk and wear shoes" . She presents for preventative foot care services. No changes to ROS. She admits to dropping a drill on her right foot months ago and it is starting to become sore.       Podiatric Exam: Vascular: dorsalis pedis and posterior tibial pulses are palpable bilateral. Capillary return is immediate. Temperature gradient is WNL. Skin turgor WNL  Sensorium: Normal Semmes Weinstein monofilament test. Normal tactile sensation bilaterally. Nail Exam: Pt has thick disfigured discolored nails with subungual debris noted bilateral entire nail hallux through fifth toenails Ulcer Exam: There is no evidence of ulcer or pre-ulcerative changes or infection. Orthopedic Exam: Muscle tone and strength are WNL. No limitations in general ROM. No crepitus or effusions noted. Foot type and digits show no abnormalities. Bony prominences are unremarkable. Palpable pain 4th metatarsal left foot. Skin: No Porokeratosis. No infection or ulcers  Diagnosis:  Tinea unguium, Pain in right toe, pain in left toes.   Treatment & Plan Procedures and Treatment: Consent by patient was obtained for treatment procedures. The patient understood the discussion of treatment and procedures well. All questions were answered thoroughly reviewed. Debridement of mycotic and hypertrophic toenails, 1 through 5 bilateral and clearing of subungual debris. No ulceration, no infection.  Return Visit-Office Procedure: Patient instructed to return to the office for a follow up visit 3 months for continued evaluation and treatment.  Gardiner Barefoot DPM

## 2016-02-15 DIAGNOSIS — I700 Atherosclerosis of aorta: Secondary | ICD-10-CM | POA: Diagnosis not present

## 2016-02-15 DIAGNOSIS — G5601 Carpal tunnel syndrome, right upper limb: Secondary | ICD-10-CM | POA: Diagnosis not present

## 2016-02-15 DIAGNOSIS — R0781 Pleurodynia: Secondary | ICD-10-CM | POA: Diagnosis not present

## 2016-02-15 DIAGNOSIS — M545 Low back pain: Secondary | ICD-10-CM | POA: Diagnosis not present

## 2016-02-15 DIAGNOSIS — Z23 Encounter for immunization: Secondary | ICD-10-CM | POA: Diagnosis not present

## 2016-02-15 DIAGNOSIS — R0789 Other chest pain: Secondary | ICD-10-CM | POA: Diagnosis not present

## 2016-03-23 DIAGNOSIS — G4733 Obstructive sleep apnea (adult) (pediatric): Secondary | ICD-10-CM | POA: Diagnosis not present

## 2016-03-23 DIAGNOSIS — R5383 Other fatigue: Secondary | ICD-10-CM | POA: Diagnosis not present

## 2016-03-28 DIAGNOSIS — G4733 Obstructive sleep apnea (adult) (pediatric): Secondary | ICD-10-CM | POA: Diagnosis not present

## 2016-05-09 ENCOUNTER — Ambulatory Visit: Payer: Medicare Other | Admitting: Sports Medicine

## 2016-05-18 ENCOUNTER — Encounter: Payer: Self-pay | Admitting: Sports Medicine

## 2016-05-18 ENCOUNTER — Ambulatory Visit (INDEPENDENT_AMBULATORY_CARE_PROVIDER_SITE_OTHER): Payer: Medicare Other | Admitting: Sports Medicine

## 2016-05-18 DIAGNOSIS — G588 Other specified mononeuropathies: Secondary | ICD-10-CM

## 2016-05-18 DIAGNOSIS — B351 Tinea unguium: Secondary | ICD-10-CM | POA: Diagnosis not present

## 2016-05-18 DIAGNOSIS — M79676 Pain in unspecified toe(s): Secondary | ICD-10-CM

## 2016-05-18 NOTE — Progress Notes (Signed)
Subjective: Alejandra Greene is a 71 y.o. female patient seen today in office with complaint of painful thickened and elongated toenails; unable to trim. Patient denies history of Diabetes or Vascular disease. Admits to history of neuropathy and fibromyalgia. Patient has no other pedal complaints at this time.   There are no active problems to display for this patient.   Current Outpatient Prescriptions on File Prior to Visit  Medication Sig Dispense Refill  . Alendronate Sodium (FOSAMAX PO) Take 70 mg by mouth once a week.    Marland Kitchen amoxicillin (AMOXIL) 500 MG capsule     . aspirin 81 MG tablet Take 81 mg by mouth daily.    . cefdinir (OMNICEF) 300 MG capsule     . clorazepate (TRANXENE) 7.5 MG tablet     . ergocalciferol (VITAMIN D2) 50000 UNITS capsule Take 5,000 Units by mouth once a week.    Marland Kitchen ibuprofen (ADVIL,MOTRIN) 800 MG tablet     . lisinopril (PRINIVIL,ZESTRIL) 20 MG tablet     . lisinopril-hydrochlorothiazide (PRINZIDE,ZESTORETIC) 20-25 MG per tablet     . Magnesium 250 MG TABS Take by mouth 2 (two) times daily.    . meloxicam (MOBIC) 15 MG tablet Take 1 tablet (15 mg total) by mouth daily. 30 tablet 0  . Multiple Vitamin (MULTIVITAMIN) capsule Take 1 capsule by mouth daily.    . Omega-3 Fatty Acids (FISH OIL) 1200 MG CAPS Take by mouth.    . phentermine (ADIPEX-P) 37.5 MG tablet     . pravastatin (PRAVACHOL) 10 MG tablet Take 10 mg by mouth daily.    Marland Kitchen terbinafine (LAMISIL) 250 MG tablet Take 1 tablet (250 mg total) by mouth daily. 30 tablet 2  . topiramate (TOPAMAX) 100 MG tablet     . vitamin B-12 (CYANOCOBALAMIN) 100 MCG tablet Take 100 mcg by mouth daily.     No current facility-administered medications on file prior to visit.     No Known Allergies  Objective: Physical Exam  General: Well developed, nourished, no acute distress, awake, alert and oriented x 3  Vascular: Dorsalis pedis artery 1/4 bilateral, Posterior tibial artery 1/4 bilateral, skin temperature warm to  warm proximal to distal bilateral lower extremities, mild varicosities, pedal hair present bilateral.  Neurological: Gross sensation present via light touch bilateral. Subjective burning, tingling, numbness to feet.  Dermatological: Skin is warm, dry, and supple bilateral, Nails 1-10 are tender, long, thick, and discolored with mild subungal debris, no webspace macerations present bilateral, no open lesions present bilateral, no callus/corns/hyperkeratotic tissue present bilateral. No signs of infection bilateral.  Musculoskeletal: Aymptomatic hammertoe boney deformities noted bilateral. Muscular strength within normal limits without pain on range of motion. No pain with calf compression bilateral.  Assessment and Plan:  Problem List Items Addressed This Visit    None    Visit Diagnoses    Onychomycosis    -  Primary   Pain of toe, unspecified laterality       Other mononeuropathy          -Examined patient.  -Discussed treatment options for painful mycotic nails. -Mechanically debrided and reduced mycotic nails with sterile nail nipper and dremel nail file without incident. -Patient to return in 3 months for follow up evaluation or sooner if symptoms worsen.  Landis Martins, DPM

## 2016-06-21 DIAGNOSIS — Z6835 Body mass index (BMI) 35.0-35.9, adult: Secondary | ICD-10-CM | POA: Diagnosis not present

## 2016-06-21 DIAGNOSIS — Z0000 Encounter for general adult medical examination without abnormal findings: Secondary | ICD-10-CM | POA: Diagnosis not present

## 2016-06-28 DIAGNOSIS — E782 Mixed hyperlipidemia: Secondary | ICD-10-CM | POA: Diagnosis not present

## 2016-06-28 DIAGNOSIS — I10 Essential (primary) hypertension: Secondary | ICD-10-CM | POA: Diagnosis not present

## 2016-08-15 ENCOUNTER — Ambulatory Visit: Payer: Medicare Other | Admitting: Sports Medicine

## 2016-08-24 ENCOUNTER — Encounter: Payer: Self-pay | Admitting: Sports Medicine

## 2016-08-24 ENCOUNTER — Ambulatory Visit (INDEPENDENT_AMBULATORY_CARE_PROVIDER_SITE_OTHER): Payer: Medicare Other | Admitting: Sports Medicine

## 2016-08-24 DIAGNOSIS — M79676 Pain in unspecified toe(s): Secondary | ICD-10-CM

## 2016-08-24 DIAGNOSIS — B351 Tinea unguium: Secondary | ICD-10-CM

## 2016-08-24 DIAGNOSIS — G588 Other specified mononeuropathies: Secondary | ICD-10-CM

## 2016-08-25 NOTE — Progress Notes (Signed)
Subjective: Alejandra Greene is a 71 y.o. female patient seen today in office with complaint of painful thickened and elongated toenails; unable to trim. Patient denies changes with medical history or medications since last visit. Patient has no other pedal complaints at this time.   There are no active problems to display for this patient.   Current Outpatient Prescriptions on File Prior to Visit  Medication Sig Dispense Refill  . Alendronate Sodium (FOSAMAX PO) Take 70 mg by mouth once a week.    Marland Kitchen amoxicillin (AMOXIL) 500 MG capsule     . aspirin 81 MG tablet Take 81 mg by mouth daily.    . cefdinir (OMNICEF) 300 MG capsule     . clorazepate (TRANXENE) 7.5 MG tablet     . ergocalciferol (VITAMIN D2) 50000 UNITS capsule Take 5,000 Units by mouth once a week.    Marland Kitchen ibuprofen (ADVIL,MOTRIN) 800 MG tablet     . lisinopril (PRINIVIL,ZESTRIL) 20 MG tablet     . lisinopril-hydrochlorothiazide (PRINZIDE,ZESTORETIC) 20-25 MG per tablet     . Magnesium 250 MG TABS Take by mouth 2 (two) times daily.    . meloxicam (MOBIC) 15 MG tablet Take 1 tablet (15 mg total) by mouth daily. 30 tablet 0  . Multiple Vitamin (MULTIVITAMIN) capsule Take 1 capsule by mouth daily.    . Omega-3 Fatty Acids (FISH OIL) 1200 MG CAPS Take by mouth.    . phentermine (ADIPEX-P) 37.5 MG tablet     . pravastatin (PRAVACHOL) 10 MG tablet Take 10 mg by mouth daily.    Marland Kitchen terbinafine (LAMISIL) 250 MG tablet Take 1 tablet (250 mg total) by mouth daily. 30 tablet 2  . topiramate (TOPAMAX) 100 MG tablet     . vitamin B-12 (CYANOCOBALAMIN) 100 MCG tablet Take 100 mcg by mouth daily.     No current facility-administered medications on file prior to visit.     No Known Allergies  Objective: Physical Exam  General: Well developed, nourished, no acute distress, awake, alert and oriented x 3  Vascular: Dorsalis pedis artery 1/4 bilateral, Posterior tibial artery 1/4 bilateral, skin temperature warm to warm proximal to distal  bilateral lower extremities, mild varicosities, pedal hair present bilateral.  Neurological: Gross sensation present via light touch bilateral. Subjective burning, tingling, numbness to feet.  Dermatological: Skin is warm, dry, and supple bilateral, Nails 1-10 are tender, long, thick, and discolored with mild subungal debris, no webspace macerations present bilateral, no open lesions present bilateral, no callus/corns/hyperkeratotic tissue present bilateral. No signs of infection bilateral.  Musculoskeletal: Aymptomatic hammertoe boney deformities noted bilateral. Muscular strength within normal limits without pain on range of motion. No pain with calf compression bilateral.  Assessment and Plan:  Problem List Items Addressed This Visit    None    Visit Diagnoses    Onychomycosis    -  Primary   Pain of toe, unspecified laterality       Other mononeuropathy          -Examined patient.  -Discussed treatment options for painful mycotic nails. -Mechanically debrided and reduced mycotic nails with sterile nail nipper and dremel nail file without incident. -Patient to return in 3 months for follow up evaluation or sooner if symptoms worsen.  Landis Martins, DPM

## 2016-09-20 DIAGNOSIS — G4733 Obstructive sleep apnea (adult) (pediatric): Secondary | ICD-10-CM | POA: Diagnosis not present

## 2016-09-20 DIAGNOSIS — R5383 Other fatigue: Secondary | ICD-10-CM | POA: Diagnosis not present

## 2016-09-21 DIAGNOSIS — G4733 Obstructive sleep apnea (adult) (pediatric): Secondary | ICD-10-CM | POA: Diagnosis not present

## 2016-10-02 DIAGNOSIS — B354 Tinea corporis: Secondary | ICD-10-CM | POA: Diagnosis not present

## 2016-11-21 ENCOUNTER — Ambulatory Visit (INDEPENDENT_AMBULATORY_CARE_PROVIDER_SITE_OTHER): Payer: Medicare Other | Admitting: Sports Medicine

## 2016-11-21 DIAGNOSIS — B351 Tinea unguium: Secondary | ICD-10-CM | POA: Diagnosis not present

## 2016-11-21 DIAGNOSIS — G588 Other specified mononeuropathies: Secondary | ICD-10-CM

## 2016-11-21 DIAGNOSIS — M79676 Pain in unspecified toe(s): Secondary | ICD-10-CM

## 2016-11-21 NOTE — Progress Notes (Signed)
Subjective: Alejandra Greene is a 71 y.o. female patient seen today in office with complaint of painful thickened and elongated toenails; unable to trim. Patient states that it seems like they're starting to grow in. Patient denies changes with medical history or medications since last visit. Patient has no other pedal complaints at this time.   There are no active problems to display for this patient.   Current Outpatient Prescriptions on File Prior to Visit  Medication Sig Dispense Refill  . Alendronate Sodium (FOSAMAX PO) Take 70 mg by mouth once a week.    Marland Kitchen aspirin 81 MG tablet Take 81 mg by mouth daily.    . ergocalciferol (VITAMIN D2) 50000 UNITS capsule Take 5,000 Units by mouth once a week.    Marland Kitchen lisinopril (PRINIVIL,ZESTRIL) 20 MG tablet     . Multiple Vitamin (MULTIVITAMIN) capsule Take 1 capsule by mouth daily.    . Omega-3 Fatty Acids (FISH OIL) 1200 MG CAPS Take by mouth.    . pravastatin (PRAVACHOL) 10 MG tablet Take 10 mg by mouth daily.    . vitamin B-12 (CYANOCOBALAMIN) 100 MCG tablet Take 100 mcg by mouth daily.    Marland Kitchen amoxicillin (AMOXIL) 500 MG capsule     . cefdinir (OMNICEF) 300 MG capsule     . clorazepate (TRANXENE) 7.5 MG tablet     . ibuprofen (ADVIL,MOTRIN) 800 MG tablet     . lisinopril-hydrochlorothiazide (PRINZIDE,ZESTORETIC) 20-25 MG per tablet     . Magnesium 250 MG TABS Take by mouth 2 (two) times daily.    . meloxicam (MOBIC) 15 MG tablet Take 1 tablet (15 mg total) by mouth daily. (Patient not taking: Reported on 11/21/2016) 30 tablet 0  . phentermine (ADIPEX-P) 37.5 MG tablet     . terbinafine (LAMISIL) 250 MG tablet Take 1 tablet (250 mg total) by mouth daily. (Patient not taking: Reported on 11/21/2016) 30 tablet 2  . topiramate (TOPAMAX) 100 MG tablet      No current facility-administered medications on file prior to visit.     No Known Allergies  Objective: Physical Exam  General: Well developed, nourished, no acute distress, awake, alert and  oriented x 3  Vascular: Dorsalis pedis artery 1/4 bilateral, Posterior tibial artery 1/4 bilateral, skin temperature warm to warm proximal to distal bilateral lower extremities, mild varicosities, pedal hair present bilateral.  Neurological: Gross sensation present via light touch bilateral. Subjective burning, tingling, numbness to feet.  Dermatological: Skin is warm, dry, and supple bilateral, Nails 1-10 are tender, long, thick, and discolored with mild subungal debris, no acute ingrowing, no webspace macerations present bilateral, no open lesions present bilateral, no callus/corns/hyperkeratotic tissue present bilateral. No signs of infection bilateral.  Musculoskeletal: Aymptomatic hammertoe boney deformities noted bilateral. Muscular strength within normal limits without pain on range of motion. No pain with calf compression bilateral.  Assessment and Plan:  Problem List Items Addressed This Visit    None    Visit Diagnoses    Onychomycosis    -  Primary   Pain of toe, unspecified laterality       Other mononeuropathy          -Examined patient.  -Discussed treatment options for painful mycotic nails. -Mechanically debrided and reduced mycotic nails with sterile nail nipper and dremel nail file without incident. -Patient to return in 3 months for follow up evaluation or sooner if symptoms worsen.  Landis Martins, DPM

## 2016-12-20 DIAGNOSIS — M19011 Primary osteoarthritis, right shoulder: Secondary | ICD-10-CM | POA: Diagnosis not present

## 2016-12-20 DIAGNOSIS — M542 Cervicalgia: Secondary | ICD-10-CM | POA: Diagnosis not present

## 2016-12-27 DIAGNOSIS — G4733 Obstructive sleep apnea (adult) (pediatric): Secondary | ICD-10-CM | POA: Diagnosis not present

## 2016-12-27 DIAGNOSIS — M19011 Primary osteoarthritis, right shoulder: Secondary | ICD-10-CM | POA: Diagnosis not present

## 2016-12-27 DIAGNOSIS — G542 Cervical root disorders, not elsewhere classified: Secondary | ICD-10-CM | POA: Diagnosis not present

## 2016-12-27 DIAGNOSIS — E782 Mixed hyperlipidemia: Secondary | ICD-10-CM | POA: Diagnosis not present

## 2016-12-27 DIAGNOSIS — M797 Fibromyalgia: Secondary | ICD-10-CM | POA: Diagnosis not present

## 2016-12-27 DIAGNOSIS — I10 Essential (primary) hypertension: Secondary | ICD-10-CM | POA: Diagnosis not present

## 2016-12-27 DIAGNOSIS — Z6834 Body mass index (BMI) 34.0-34.9, adult: Secondary | ICD-10-CM | POA: Diagnosis not present

## 2017-01-01 ENCOUNTER — Other Ambulatory Visit: Payer: Self-pay | Admitting: Orthopaedic Surgery

## 2017-01-01 DIAGNOSIS — I10 Essential (primary) hypertension: Secondary | ICD-10-CM | POA: Diagnosis not present

## 2017-01-01 DIAGNOSIS — M19011 Primary osteoarthritis, right shoulder: Secondary | ICD-10-CM | POA: Diagnosis not present

## 2017-01-01 DIAGNOSIS — M25511 Pain in right shoulder: Secondary | ICD-10-CM

## 2017-01-03 ENCOUNTER — Ambulatory Visit
Admission: RE | Admit: 2017-01-03 | Discharge: 2017-01-03 | Disposition: A | Discharge status: Home / Self Care | Payer: Medicare Other | Source: Ambulatory Visit | Attending: Orthopaedic Surgery | Admitting: Orthopaedic Surgery

## 2017-01-03 DIAGNOSIS — Z01810 Encounter for preprocedural cardiovascular examination: Secondary | ICD-10-CM | POA: Diagnosis not present

## 2017-01-03 DIAGNOSIS — D72828 Other elevated white blood cell count: Secondary | ICD-10-CM | POA: Diagnosis not present

## 2017-01-03 DIAGNOSIS — Z471 Aftercare following joint replacement surgery: Secondary | ICD-10-CM | POA: Diagnosis not present

## 2017-01-03 DIAGNOSIS — R7989 Other specified abnormal findings of blood chemistry: Secondary | ICD-10-CM | POA: Diagnosis not present

## 2017-01-03 DIAGNOSIS — M25511 Pain in right shoulder: Secondary | ICD-10-CM

## 2017-01-03 DIAGNOSIS — Z96611 Presence of right artificial shoulder joint: Secondary | ICD-10-CM | POA: Diagnosis not present

## 2017-01-08 ENCOUNTER — Encounter (HOSPITAL_COMMUNITY)
Admission: RE | Admit: 2017-01-08 | Discharge: 2017-01-08 | Disposition: A | Discharge status: Home / Self Care | Payer: Medicare Other | Source: Ambulatory Visit | Attending: Orthopaedic Surgery | Admitting: Orthopaedic Surgery

## 2017-01-08 ENCOUNTER — Encounter (HOSPITAL_COMMUNITY): Payer: Self-pay

## 2017-01-08 DIAGNOSIS — M19011 Primary osteoarthritis, right shoulder: Secondary | ICD-10-CM | POA: Insufficient documentation

## 2017-01-08 HISTORY — DX: Other specified postprocedural states: R11.2

## 2017-01-08 HISTORY — DX: Sleep apnea, unspecified: G47.30

## 2017-01-08 HISTORY — DX: Nausea with vomiting, unspecified: Z98.890

## 2017-01-08 HISTORY — DX: Essential (primary) hypertension: I10

## 2017-01-08 HISTORY — DX: Other complications of anesthesia, initial encounter: T88.59XA

## 2017-01-08 HISTORY — DX: Adverse effect of unspecified anesthetic, initial encounter: T41.45XA

## 2017-01-08 LAB — SURGICAL PCR SCREEN
MRSA, PCR: NEGATIVE
STAPHYLOCOCCUS AUREUS: NEGATIVE

## 2017-01-08 NOTE — Pre-Procedure Instructions (Signed)
Roberts Gaudy  01/08/2017      [facility] [address] [place] [address] Phone: [phone] Fax: [phone]    Your procedure is scheduled on October 10  Report to [facility] at [place].M.  Call this number if you have problems the morning of surgery:  [phone]   Remember:  Do not eat food or drink liquids after midnight.   Continue all other medications as directed by your physician except follow these medication instructions before surgery   Take these medicines the morning of surgery with A SIP OF WATER  acetaminophen (TYLENOL) if needed cyclobenzaprine (FLEXERIL) if needed ranitidine (ZANTAC)   7 days prior to surgery STOP taking any Aspirin (unless otherwise instructed by your surgeon), Aleve, Naproxen, Ibuprofen, Motrin, Advil, Goody's, BC's, all herbal medications, fish oil, and all vitamins    Do not wear jewelry, make-up or nail polish.  Do not wear lotions, powders, or perfumes, or deoderant.  Do not shave 48 hours prior to surgery.  Men may shave face and neck.  Do not bring valuables to the hospital.  [facility] is not responsible for any belongings or valuables.  Contacts, dentures or bridgework may not be worn into surgery.  Leave your suitcase in the car.  After surgery it may be brought to your room.  For patients admitted to the hospital, discharge time will be determined by your treatment team.  Patients discharged the day of surgery will not be allowed to drive home.    Special instructions:   [facility]- Preparing For Surgery  Before surgery, you can play an important role. Because skin is not sterile, your skin needs to be as free of germs as possible. You can reduce the number of germs on your skin by washing with CHG (chlorahexidine gluconate) Soap before surgery.  CHG is an antiseptic cleaner which kills germs and bonds with the skin to continue  killing germs even after washing.  Please do not use if you have an allergy to CHG or antibacterial soaps. If your skin becomes reddened/irritated stop using the CHG.  Do not shave (including legs and underarms) for at least 48 hours prior to first CHG shower. It is OK to shave your face.  Please follow these instructions carefully.   1. Shower the NIGHT BEFORE SURGERY and the MORNING OF SURGERY with CHG.   2. If you chose to wash your hair, wash your hair first as usual with your normal shampoo.  3. After you shampoo, rinse your hair and body thoroughly to remove the shampoo.  4. Use CHG as you would any other liquid soap. You can apply CHG directly to the skin and wash gently with a scrungie or a clean washcloth.   5. Apply the CHG Soap to your body ONLY FROM THE NECK DOWN.  Do not use on open wounds or open sores. Avoid contact with your eyes, ears, mouth and genitals (private parts). Wash genitals (private parts) with your normal soap.  6. Wash thoroughly, paying special attention to the area where your surgery will be performed.  7. Thoroughly rinse your body with warm water from the neck down.  8. DO NOT shower/wash with your normal soap after using and rinsing off the CHG Soap.  9. Pat yourself dry with a CLEAN TOWEL.   10. Wear CLEAN PAJAMAS   11. Place CLEAN SHEETS on your bed the night of  your first shower and DO NOT SLEEP WITH PETS.    Day of Surgery: Do not apply any deodorants/lotions. Please wear clean clothes to the hospital/surgery center.      Please read over the following fact sheets that you were given.

## 2017-01-08 NOTE — Progress Notes (Signed)
PCP - Dirk Dress Cardiologist - denies  Chest x-ray - not needed EKG - 01/03/17 Stress Test - denies ECHO - denies Cardiac Cath - denies  Sleep study requesting Wears CPAP at night - instructed to bring mask and tubing with her the DOS  Patient denies shortness of breath, fever, cough and chest pain at PAT appointment   Patient verbalized understanding of instructions that were given to them at the PAT appointment. Patient was also instructed that they will need to review over the PAT instructions again at home before surgery.

## 2017-01-15 MED ORDER — CEFAZOLIN SODIUM-DEXTROSE 2-4 GM/100ML-% IV SOLN
2.0000 g | INTRAVENOUS | Status: AC
  Administered 2017-01-16: 2 g via INTRAVENOUS

## 2017-01-15 MED ORDER — TRANEXAMIC ACID 1000 MG/10ML IV SOLN
1000.0000 mg | INTRAVENOUS | Status: AC
  Administered 2017-01-16: 1000 mg via INTRAVENOUS
  Filled 2017-01-15: qty 10

## 2017-01-15 NOTE — Anesthesia Preprocedure Evaluation (Addendum)
Anesthesia Evaluation  Patient identified by MRN, date of birth, ID band Patient awake    Reviewed: Allergy & Precautions, NPO status , Patient's Chart, lab work & pertinent test results  History of Anesthesia Complications (+) PONV  Airway Mallampati: III   Neck ROM: Full    Dental  (+) Dental Advisory Given, Upper Dentures, Partial Lower   Pulmonary sleep apnea and Continuous Positive Airway Pressure Ventilation , former smoker,    Pulmonary exam normal breath sounds clear to auscultation       Cardiovascular Exercise Tolerance: Good hypertension, Normal cardiovascular exam Rhythm:Regular Rate:Normal     Neuro/Psych negative neurological ROS  negative psych ROS   GI/Hepatic Neg liver ROS, IBS   Endo/Other  Obesity  Renal/GU negative Renal ROS  negative genitourinary   Musculoskeletal  (+) Arthritis , Osteoarthritis,  Fibromyalgia -  Abdominal   Peds  Hematology negative hematology ROS (+)   Anesthesia Other Findings   Reproductive/Obstetrics                            Anesthesia Physical Anesthesia Plan  ASA: II  Anesthesia Plan: General   Post-op Pain Management:  Regional for Post-op pain   Induction: Intravenous  PONV Risk Score and Plan: 4 or greater and Ondansetron, Dexamethasone, Midazolam, Scopolamine patch - Pre-op, Treatment may vary due to age or medical condition and Propofol infusion  Airway Management Planned: Oral ETT  Additional Equipment: None  Intra-op Plan:   Post-operative Plan: Extubation in OR  Informed Consent: I have reviewed the patients History and Physical, chart, labs and discussed the procedure including the risks, benefits and alternatives for the proposed anesthesia with the patient or authorized representative who has indicated his/her understanding and acceptance.   Dental advisory given  Plan Discussed with: CRNA  Anesthesia Plan  Comments:        Anesthesia Quick Evaluation

## 2017-01-16 ENCOUNTER — Inpatient Hospital Stay (HOSPITAL_COMMUNITY)
Admission: RE | Admit: 2017-01-16 | Discharge: 2017-01-17 | DRG: 483 | Disposition: A | Discharge status: Home / Self Care | Payer: Medicare Other | Source: Ambulatory Visit | Attending: Orthopaedic Surgery | Admitting: Orthopaedic Surgery

## 2017-01-16 ENCOUNTER — Inpatient Hospital Stay (HOSPITAL_COMMUNITY): Payer: Medicare Other | Admitting: Vascular Surgery

## 2017-01-16 ENCOUNTER — Encounter (HOSPITAL_COMMUNITY)
Admission: RE | Disposition: A | Discharge status: Home / Self Care | Payer: Self-pay | Source: Ambulatory Visit | Attending: Orthopaedic Surgery

## 2017-01-16 ENCOUNTER — Inpatient Hospital Stay (HOSPITAL_COMMUNITY): Payer: Medicare Other | Admitting: Certified Registered Nurse Anesthetist

## 2017-01-16 ENCOUNTER — Inpatient Hospital Stay (HOSPITAL_COMMUNITY): Payer: Medicare Other

## 2017-01-16 ENCOUNTER — Encounter (HOSPITAL_COMMUNITY): Payer: Self-pay | Admitting: Certified Registered"

## 2017-01-16 DIAGNOSIS — Z7983 Long term (current) use of bisphosphonates: Secondary | ICD-10-CM

## 2017-01-16 DIAGNOSIS — Z79899 Other long term (current) drug therapy: Secondary | ICD-10-CM | POA: Diagnosis not present

## 2017-01-16 DIAGNOSIS — G4730 Sleep apnea, unspecified: Secondary | ICD-10-CM | POA: Diagnosis not present

## 2017-01-16 DIAGNOSIS — I10 Essential (primary) hypertension: Secondary | ICD-10-CM | POA: Diagnosis not present

## 2017-01-16 DIAGNOSIS — Z90710 Acquired absence of both cervix and uterus: Secondary | ICD-10-CM

## 2017-01-16 DIAGNOSIS — Z09 Encounter for follow-up examination after completed treatment for conditions other than malignant neoplasm: Secondary | ICD-10-CM

## 2017-01-16 DIAGNOSIS — Z7982 Long term (current) use of aspirin: Secondary | ICD-10-CM

## 2017-01-16 DIAGNOSIS — Z87891 Personal history of nicotine dependence: Secondary | ICD-10-CM | POA: Diagnosis not present

## 2017-01-16 DIAGNOSIS — G8918 Other acute postprocedural pain: Secondary | ICD-10-CM | POA: Diagnosis not present

## 2017-01-16 DIAGNOSIS — M659 Synovitis and tenosynovitis, unspecified: Secondary | ICD-10-CM | POA: Diagnosis present

## 2017-01-16 DIAGNOSIS — M19011 Primary osteoarthritis, right shoulder: Secondary | ICD-10-CM | POA: Diagnosis not present

## 2017-01-16 DIAGNOSIS — Z96611 Presence of right artificial shoulder joint: Secondary | ICD-10-CM | POA: Diagnosis not present

## 2017-01-16 DIAGNOSIS — M810 Age-related osteoporosis without current pathological fracture: Secondary | ICD-10-CM | POA: Diagnosis present

## 2017-01-16 DIAGNOSIS — Z471 Aftercare following joint replacement surgery: Secondary | ICD-10-CM | POA: Diagnosis not present

## 2017-01-16 DIAGNOSIS — Z888 Allergy status to other drugs, medicaments and biological substances status: Secondary | ICD-10-CM | POA: Diagnosis not present

## 2017-01-16 HISTORY — PX: TOTAL SHOULDER ARTHROPLASTY: SHX126

## 2017-01-16 HISTORY — PX: REVERSE SHOULDER ARTHROPLASTY: SHX5054

## 2017-01-16 SURGERY — ARTHROPLASTY, SHOULDER, TOTAL
Anesthesia: General | Laterality: Right

## 2017-01-16 MED ORDER — FENTANYL CITRATE (PF) 100 MCG/2ML IJ SOLN
INTRAMUSCULAR | Status: DC | PRN
  Administered 2017-01-16 (×2): 50 ug via INTRAVENOUS
  Administered 2017-01-16: 25 ug via INTRAVENOUS

## 2017-01-16 MED ORDER — LISINOPRIL 5 MG PO TABS
5.0000 mg | ORAL_TABLET | Freq: Every day | ORAL | Status: DC
  Administered 2017-01-17: 5 mg via ORAL
  Filled 2017-01-16: qty 1

## 2017-01-16 MED ORDER — MIDAZOLAM HCL 2 MG/2ML IJ SOLN
INTRAMUSCULAR | Status: AC
  Filled 2017-01-16: qty 2

## 2017-01-16 MED ORDER — ONDANSETRON HCL 4 MG/2ML IJ SOLN: 4.0000 mg | Freq: Once | INTRAMUSCULAR | Status: DC | PRN

## 2017-01-16 MED ORDER — DEXAMETHASONE SODIUM PHOSPHATE 10 MG/ML IJ SOLN
INTRAMUSCULAR | Status: AC
  Filled 2017-01-16: qty 1

## 2017-01-16 MED ORDER — LIDOCAINE 2% (20 MG/ML) 5 ML SYRINGE
INTRAMUSCULAR | Status: AC
  Filled 2017-01-16: qty 5

## 2017-01-16 MED ORDER — DIPHENHYDRAMINE HCL 12.5 MG/5ML PO ELIX: 12.5000 mg | ORAL_SOLUTION | ORAL | Status: DC | PRN

## 2017-01-16 MED ORDER — FENTANYL CITRATE (PF) 250 MCG/5ML IJ SOLN
INTRAMUSCULAR | Status: AC
  Filled 2017-01-16: qty 5

## 2017-01-16 MED ORDER — CHLORHEXIDINE GLUCONATE 4 % EX LIQD: 60.0000 mL | Freq: Once | CUTANEOUS | Status: DC

## 2017-01-16 MED ORDER — CELECOXIB 200 MG PO CAPS
200.0000 mg | ORAL_CAPSULE | Freq: Two times a day (BID) | ORAL | Status: DC
  Administered 2017-01-16 – 2017-01-17 (×2): 200 mg via ORAL
  Filled 2017-01-16 (×2): qty 1

## 2017-01-16 MED ORDER — PHENYLEPHRINE 40 MCG/ML (10ML) SYRINGE FOR IV PUSH (FOR BLOOD PRESSURE SUPPORT)
PREFILLED_SYRINGE | INTRAVENOUS | Status: DC | PRN
  Administered 2017-01-16: 40 ug via INTRAVENOUS
  Administered 2017-01-16 (×2): 80 ug via INTRAVENOUS

## 2017-01-16 MED ORDER — ONDANSETRON HCL 4 MG/2ML IJ SOLN: 4.0000 mg | Freq: Four times a day (QID) | INTRAMUSCULAR | Status: DC | PRN

## 2017-01-16 MED ORDER — ONDANSETRON HCL 4 MG/2ML IJ SOLN
INTRAMUSCULAR | Status: DC | PRN
  Administered 2017-01-16: 4 mg via INTRAVENOUS

## 2017-01-16 MED ORDER — ACETAMINOPHEN 500 MG PO TABS
1000.0000 mg | ORAL_TABLET | Freq: Four times a day (QID) | ORAL | Status: DC
  Administered 2017-01-16 – 2017-01-17 (×3): 1000 mg via ORAL
  Filled 2017-01-16 (×3): qty 2

## 2017-01-16 MED ORDER — PRAVASTATIN SODIUM 20 MG PO TABS
10.0000 mg | ORAL_TABLET | Freq: Every day | ORAL | Status: DC
  Administered 2017-01-16: 10 mg via ORAL
  Filled 2017-01-16: qty 1

## 2017-01-16 MED ORDER — LACTATED RINGERS IV SOLN
INTRAVENOUS | Status: DC | PRN
  Administered 2017-01-16 (×2): via INTRAVENOUS

## 2017-01-16 MED ORDER — ONDANSETRON HCL 4 MG PO TABS: 4.0000 mg | ORAL_TABLET | Freq: Four times a day (QID) | ORAL | Status: DC | PRN

## 2017-01-16 MED ORDER — MIDAZOLAM HCL 5 MG/5ML IJ SOLN
INTRAMUSCULAR | Status: DC | PRN
  Administered 2017-01-16 (×2): 1 mg via INTRAVENOUS

## 2017-01-16 MED ORDER — CEFAZOLIN SODIUM-DEXTROSE 1-4 GM/50ML-% IV SOLN
1.0000 g | Freq: Four times a day (QID) | INTRAVENOUS | Status: AC
  Administered 2017-01-16 – 2017-01-17 (×3): 1 g via INTRAVENOUS
  Filled 2017-01-16 (×3): qty 50

## 2017-01-16 MED ORDER — BUPIVACAINE-EPINEPHRINE 0.25% -1:200000 IJ SOLN
INTRAMUSCULAR | Status: AC
  Filled 2017-01-16: qty 1

## 2017-01-16 MED ORDER — ROCURONIUM BROMIDE 100 MG/10ML IV SOLN
INTRAVENOUS | Status: DC | PRN
  Administered 2017-01-16: 60 mg via INTRAVENOUS
  Administered 2017-01-16: 5 mg via INTRAVENOUS

## 2017-01-16 MED ORDER — PROPOFOL 10 MG/ML IV BOLUS
INTRAVENOUS | Status: DC | PRN
  Administered 2017-01-16: 140 mg via INTRAVENOUS

## 2017-01-16 MED ORDER — MORPHINE SULFATE (PF) 4 MG/ML IV SOLN: 2.0000 mg | INTRAVENOUS | Status: DC | PRN

## 2017-01-16 MED ORDER — OXYCODONE HCL 5 MG PO TABS
5.0000 mg | ORAL_TABLET | ORAL | Status: DC | PRN
  Administered 2017-01-16: 10 mg via ORAL
  Administered 2017-01-17: 5 mg via ORAL
  Administered 2017-01-17: 10 mg via ORAL
  Filled 2017-01-16 (×2): qty 2
  Filled 2017-01-16: qty 1
  Filled 2017-01-16: qty 2

## 2017-01-16 MED ORDER — SENNA 8.6 MG PO TABS
1.0000 | ORAL_TABLET | Freq: Two times a day (BID) | ORAL | Status: DC
  Administered 2017-01-16 – 2017-01-17 (×2): 8.6 mg via ORAL
  Filled 2017-01-16 (×2): qty 1

## 2017-01-16 MED ORDER — SUGAMMADEX SODIUM 200 MG/2ML IV SOLN
INTRAVENOUS | Status: DC | PRN
  Administered 2017-01-16: 200 mg via INTRAVENOUS

## 2017-01-16 MED ORDER — DEXAMETHASONE SODIUM PHOSPHATE 10 MG/ML IJ SOLN
INTRAMUSCULAR | Status: DC | PRN
  Administered 2017-01-16: 10 mg via INTRAVENOUS

## 2017-01-16 MED ORDER — POLYETHYLENE GLYCOL 3350 17 G PO PACK: 17.0000 g | PACK | Freq: Every day | ORAL | Status: DC | PRN

## 2017-01-16 MED ORDER — BUPIVACAINE-EPINEPHRINE (PF) 0.5% -1:200000 IJ SOLN
INTRAMUSCULAR | Status: DC | PRN
  Administered 2017-01-16: 30 mL via PERINEURAL

## 2017-01-16 MED ORDER — PHENYLEPHRINE HCL 10 MG/ML IJ SOLN
INTRAVENOUS | Status: DC | PRN
  Administered 2017-01-16: 50 ug/min via INTRAVENOUS

## 2017-01-16 MED ORDER — SUGAMMADEX SODIUM 200 MG/2ML IV SOLN
INTRAVENOUS | Status: AC
  Filled 2017-01-16: qty 2

## 2017-01-16 MED ORDER — FENTANYL CITRATE (PF) 100 MCG/2ML IJ SOLN: 25.0000 ug | INTRAMUSCULAR | Status: DC | PRN

## 2017-01-16 MED ORDER — 0.9 % SODIUM CHLORIDE (POUR BTL) OPTIME
TOPICAL | Status: DC | PRN
  Administered 2017-01-16: 1000 mL

## 2017-01-16 MED ORDER — ROCURONIUM BROMIDE 10 MG/ML (PF) SYRINGE
PREFILLED_SYRINGE | INTRAVENOUS | Status: AC
  Filled 2017-01-16: qty 5

## 2017-01-16 MED ORDER — ONDANSETRON HCL 4 MG/2ML IJ SOLN
INTRAMUSCULAR | Status: AC
  Filled 2017-01-16: qty 2

## 2017-01-16 MED ORDER — METOCLOPRAMIDE HCL 5 MG/ML IJ SOLN: 5.0000 mg | Freq: Three times a day (TID) | INTRAMUSCULAR | Status: DC | PRN

## 2017-01-16 MED ORDER — LIDOCAINE HCL (CARDIAC) 20 MG/ML IV SOLN
INTRAVENOUS | Status: DC | PRN
  Administered 2017-01-16: 80 mg via INTRAVENOUS

## 2017-01-16 MED ORDER — METOCLOPRAMIDE HCL 5 MG PO TABS: 5.0000 mg | ORAL_TABLET | Freq: Three times a day (TID) | ORAL | Status: DC | PRN

## 2017-01-16 MED ORDER — SODIUM CHLORIDE 0.9 % IR SOLN
Status: DC | PRN
  Administered 2017-01-16: 3000 mL

## 2017-01-16 SURGICAL SUPPLY — 61 items
AEQUALIS PERFORM REVERSED IMPLANT
BASEPLATE GLENOSPHERE 25 STD (Miscellaneous) ×2 IMPLANT
BASEPLATE GLENOSPHERE 25MM STD (Miscellaneous) ×1 IMPLANT
BENZOIN TINCTURE PRP APPL 2/3 (GAUZE/BANDAGES/DRESSINGS) ×3 IMPLANT
BLADE SAW SAG 29X58X.64 (BLADE) IMPLANT
BLADE SAW SAG 73X25 THK (BLADE)
BLADE SAW SGTL 73X25 THK (BLADE) IMPLANT
BONE SCREW THREAD 6.5X35MM (Screw) ×1 IMPLANT
CAP SHOULDER REVTOTAL 2 ×3 IMPLANT
CHLORAPREP W/TINT 26ML (MISCELLANEOUS) ×9 IMPLANT
CLOSURE WOUND 1/2 X4 (GAUZE/BANDAGES/DRESSINGS) ×1
COVER SURGICAL LIGHT HANDLE (MISCELLANEOUS) ×3 IMPLANT
DRAPE INCISE IOBAN 66X45 STRL (DRAPES) ×6 IMPLANT
DRAPE U-SHAPE 47X51 STRL (DRAPES) ×3 IMPLANT
DRILL BIT PERIPHERAL 3.2MM ×3 IMPLANT
DRSG AQUACEL AG ADV 3.5X 6 (GAUZE/BANDAGES/DRESSINGS) ×3 IMPLANT
DRSG AQUACEL AG ADV 3.5X10 (GAUZE/BANDAGES/DRESSINGS) ×3 IMPLANT
ELECT REM PT RETURN 9FT ADLT (ELECTROSURGICAL) ×3
ELECTRODE REM PT RTRN 9FT ADLT (ELECTROSURGICAL) ×1 IMPLANT
GLOVE BIOGEL PI IND STRL 7.0 (GLOVE) ×6 IMPLANT
GLOVE BIOGEL PI IND STRL 8 (GLOVE) ×1 IMPLANT
GLOVE BIOGEL PI INDICATOR 7.0 (GLOVE) ×12
GLOVE BIOGEL PI INDICATOR 8 (GLOVE) ×2
GLOVE SURG ORTHO 8.0 STRL STRW (GLOVE) ×6 IMPLANT
GOWN STRL REUS W/ TWL LRG LVL3 (GOWN DISPOSABLE) ×2 IMPLANT
GOWN STRL REUS W/ TWL XL LVL3 (GOWN DISPOSABLE) ×1 IMPLANT
GOWN STRL REUS W/TWL LRG LVL3 (GOWN DISPOSABLE) ×4
GOWN STRL REUS W/TWL XL LVL3 (GOWN DISPOSABLE) ×2
GUIDEWIRE GLENOID 2.5X220 (WIRE) IMPLANT
HANDPIECE INTERPULSE COAX TIP (DISPOSABLE) ×2
KIT BASIN OR (CUSTOM PROCEDURE TRAY) ×3 IMPLANT
KIT ROOM TURNOVER OR (KITS) ×3 IMPLANT
MANIFOLD NEPTUNE II (INSTRUMENTS) ×3 IMPLANT
NEEDLE HYPO 25GX1X1/2 BEV (NEEDLE) IMPLANT
NEEDLE MAYO TROCAR (NEEDLE) ×3 IMPLANT
NS IRRIG 1000ML POUR BTL (IV SOLUTION) ×3 IMPLANT
PACK SHOULDER (CUSTOM PROCEDURE TRAY) ×3 IMPLANT
PAD ARMBOARD 7.5X6 YLW CONV (MISCELLANEOUS) ×6 IMPLANT
RESTRAINT HEAD UNIVERSAL NS (MISCELLANEOUS) ×3 IMPLANT
SCREW BONE THREAD 6.5X35 (Screw) ×2 IMPLANT
SET HNDPC FAN SPRY TIP SCT (DISPOSABLE) ×1 IMPLANT
SPONGE LAP 18X18 X RAY DECT (DISPOSABLE) ×3 IMPLANT
STRIP CLOSURE SKIN 1/2X4 (GAUZE/BANDAGES/DRESSINGS) ×2 IMPLANT
SUCTION FRAZIER HANDLE 10FR (MISCELLANEOUS) ×2
SUCTION TUBE FRAZIER 10FR DISP (MISCELLANEOUS) ×1 IMPLANT
SUT ETHIBOND 2 V 37 (SUTURE) ×3 IMPLANT
SUT ETHIBOND NAB CT1 #1 30IN (SUTURE) ×3 IMPLANT
SUT FIBERWIRE #5 38 CONV NDL (SUTURE) ×18
SUT MNCRL AB 4-0 PS2 18 (SUTURE) ×3 IMPLANT
SUT MON AB 3-0 SH 27 (SUTURE) ×2
SUT MON AB 3-0 SH27 (SUTURE) ×1 IMPLANT
SUT VIC AB 0 CT1 18XCR BRD 8 (SUTURE) ×1 IMPLANT
SUT VIC AB 0 CT1 8-18 (SUTURE) ×2
SUT VIC AB 2-0 CT1 27 (SUTURE) ×6
SUT VIC AB 2-0 CT1 TAPERPNT 27 (SUTURE) ×3 IMPLANT
SUTURE FIBERWR #5 38 CONV NDL (SUTURE) ×6 IMPLANT
TOWEL OR 17X24 6PK STRL BLUE (TOWEL DISPOSABLE) ×3 IMPLANT
TOWEL OR 17X26 10 PK STRL BLUE (TOWEL DISPOSABLE) ×3 IMPLANT
TOWER CARTRIDGE SMART MIX (DISPOSABLE) IMPLANT
TRAY FOLEY CATH SILVER 14FR (SET/KITS/TRAYS/PACK) IMPLANT
WATER STERILE IRR 1000ML POUR (IV SOLUTION) ×3 IMPLANT

## 2017-01-16 NOTE — Op Note (Addendum)
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  Stable to FE 160, ER after subscap repair to 70, IR past neutral  Post-operative plan: The patient will be admitted overnight for pain control and antibiotics.  The patient will be NWB in sling.  DVT prophylaxis is not indicated in this ambulatory low risk patient with isolated upper extremity surgery.  Pain control with PRN pain medication preferring oral medicines.  Follow up plan will be scheduled in approximately 7 days for AP XR and incision check.  Surgeons:Primary: Alejandra Gash, MD  Assistant: Alejandra Ricks PA-C Location: [facility] OR ROOM 07 Anesthesia: Choice Antibiotics: Ancef 2g preop Tourniquet time: * No tourniquets in log * Estimated Blood Loss: 409 Complications: None Specimens: None Implants:  Implant Name Type Inv. Item Serial No. Manufacturer Lot No. LRB No. Used Action  STANDARD BASEPLATE AEQUALIS   [id number]  [id number] Right 1 Implanted  LATERALIZED GLENOSPHERE AEQUALIS   [id number]  [id number] Right 1 Implanted  INSERT HUMERAL 36X6MM 12.5DEG - [id number] Insert INSERT HUMERAL 36X6MM 12.5DEG [id number] Alejandra Greene Greene [id number] Right 1 Implanted  IMPLANT REVERSE SHOULDER 0X3.5 - [id number] Shoulder IMPLANT REVERSE SHOULDER 0X3.5 [id number] Alejandra Greene [id number] Right 1 Implanted  HUMERAL STEM AEQUALIS 3BX74MM - [id number] Stem HUMERAL STEM Alejandra Greene 3BX74MM [id number] Alejandra Greene [id number] Right 1 Implanted  SCREW AEQUALIS   [id number]   Right 1 Implanted  SCREW AEQUALIS     [id number]     Right 1 Implanted    Indications for Surgery:   Alejandra Greene is a 72 y.o. female with previous history of 50% rotator cuff  debridement and clinical and radiographic signs of rotator cuff arthropathy.  She had failed non-operative measures including injection and NSAIDs.  xBenefits and risks of operative and nonoperative management were discussed prior to surgery with patient/guardian(s) and informed consent form was completed.  We very specifically addressed the risk for infection with previous surgery, dislocation, fracture around prosthesis.   Procedure:   The patient was identified in the preoperative holding area where the surgical site was marked. The patient was taken to the OR where a procedural timeout was called and the above noted anesthesia was induced.  Patient was positioned beachchair on an allen table with a spider positioner.  Preoperative antibiotics were dosed.  The patient's right shoulder was prepped and draped in the usual sterile fashion.  A second preoperative timeout was called.       Standard deltopectoral approach was performed with a #10 blade. We dissected down to the subcutaneous tissues and the cephalic vein was taken laterally with the deltoid. Clavipectoral fascia was incised in line with the incision. Deep retractors were placed. The long of the biceps tendon was identified and there was significant tenosynovitis present.  Tenodesis was performed to the pectoralis tendon with #2 Ethibond. The remaining biceps was followed up into the rotator interval where it was released.   The subscapularis was taken down in a full thickness layer with capsule along the humeral neck extending inferiorly around the humeral head. We continued releasing the capsule directly off of the osteophytes inferiorly all the way around the corner. This allowed [place] to dislocate the humeral head.   The humeral head had evidence of severe  osteoarthritic wear with full-thickness cartilage loss and exposed subchondral bone. There was significant flattening of the humeral head.   The rotator cuff was carefully examined and  noted to be irreperably torn.  The decision was confirmed that a reverse total shoulder was indicated for this patient.  There were osteophytes along the inferior humeral neck. The osteophytes were removed with an osteotome and a rongeur.  Osteophytes were removed with a rongeur and an osteotome and the anatomic neck was well visualized.     A humeral cutting guide was inserted down the intramedullary canal. The version was set at 20 of retroversion. Humeral osteotomy was performed with an oscillating saw. The head fragment was passed off the back table. A starter awl was used to open the humeral canal. We next used T-handle straight sound reamers to ream up to an appropriate fit. A chisel was used to remove proximal humeral bone. We then broached starting with a size one broach and broaching up to size 3 which obtained an appropriate fit. The broach handle was removed. A cut protector was placed. The broach handle was removed and a cut protector was placed. The humerus was retracted posteriorly and we turned our attention to glenoid exposure.  The subscapularis was again identified and immediately we took care to palpate the axillary nerve anteriorly and verify its position with gentle palpation as well as the tug test.  We then released the SGHL with bovie cautery prior to placing a curved mayo at the junction of the anterior glenoid well above the axillary nerve and bluntly dissecting the subscapularis from the capsule.  We then carefully protected the axillary nerve as we gently released the inferior capsule to fully mobilize the subscapularis.  An anterior deltoid retractor was then placed as well as a small Hohmann retractor superiorly.   The glenoid was inspected and had evidence of severe osteoarthritic wear with full-thickness cartilage loss and exposed subchondral bone. The remaining labrum was removed circumferentially taking great care not to disrupt the posterior capsule.   The glenoid drill  guide was placed and used to drill a guide pin in the center, inferior position. The glenoid face was then reamed concentrically over the guide wire. The center hole was drilled over the guidepin in a near anatomic angle of version. Next the  glenoid vault was drilled back to a depth of 35 mm.  We tapped and then placed a 30mm size baseplate no additional lateralization was selected with a 35 mm x 6.5 mm length central screw.  The base plate was screwed into the glenoid vault obtaining secure fixation. We next placed superior and inferior locking screws for additional fixation.  Next a 36 mm glenosphere was selected and impacted onto the baseplate. The center screw was tightened.  We placed superior and inferior locking screws and were happy with our fixation.  We turned attention back to the humeral side. The cut protector was removed. We trialed with multiple size tray and polyethylene options and selected a 6 poly and high offset standard lateralization tray which provided good stability and range of motion without excess soft tissue tension. The offset was dialed in to match the normal anatomy. The shoulder was trialed.  There was good ROM in all planes and the shoulder was stable with no inferior translation.  The real humeral implants were opened after again confirming sizes.  The trial was removed. #5 Ethibond sutures passed through the humeral neck for subscap repair. The humeral component was press-fit obtaining  a secure fit. A +0 high offset tray was selected and impacted onto the stem.  A 36+6 polyethylene liner was impacted onto the stem.  The joint was reduced and thoroughly irrigated with pulsatile lavage. Subscap was repaired back with #5 Fiberwire sutures through bone tunnels. Hemostasis was obtained. The deltopectoral interval was reapproximated with #1 Ethibond. The subcutaneous tissues were closed with 2-0 Vicryl and the skin was closed with absorbable monocryl and steri strips.   The  wounds were cleaned and dried and an Aquacel dressing was placed. The drapes taken down. The arm was placed into sling with abduction pillow. Patient was awakened, extubated, and transferred to the recovery room in stable condition. There were no intraoperative complications. The sponge, needle, and attention counts were  correct at the end of the case.   Tawanna Cooler present and scrubbed throughout the case, critical for completion in a timely fashion, and for retraction, instrumentation, closure.

## 2017-01-16 NOTE — Progress Notes (Signed)
Orthopedic Tech Progress Note Patient Details:  Alejandra Greene Jul 22, 1945 [id number]  Ortho Devices Type of Ortho Device: Shoulder abduction pillow Ortho Device/Splint Interventions: Loanne Drilling, Darbie Biancardi 01/16/2017, 10:46 AM As ordered by Dr. Griffin Basil

## 2017-01-16 NOTE — H&P (Signed)
PREOPERATIVE H&P  Chief Complaint: DJD RIGHT SHOULDER  HPI: Alejandra Greene is a 71 y.o. female who presents for preoperative history and physical with a diagnosis of DJD RIGHT SHOULDER. Symptoms are rated as moderate to severe, and have been worsening.  This is significantly impairing activities of daily living.  She has elected for surgical management.   Past Medical History:  Diagnosis Date  . Arthritis   . Complication of anesthesia   . Fibromyalgia   . Hypertension   . IBS (irritable bowel syndrome)   . Osteoporosis   . PONV (postoperative nausea and vomiting)   . Sleep apnea    CPAP at night   Past Surgical History:  Procedure Laterality Date  . ABDOMINAL HYSTERECTOMY    . CARPAL TUNNEL RELEASE Right   . COLONOSCOPY  2017  . PLANTAR FASCIA SURGERY    . ROTATOR CUFF REPAIR Right    Social History   Social History  . Marital status: Married    Spouse name: N/A  . Number of children: N/A  . Years of education: N/A   Social History Main Topics  . Smoking status: Former Research scientist (life sciences)  . Smokeless tobacco: Never Used  . Alcohol use No  . Drug use: No  . Sexual activity: Not Asked   Other Topics Concern  . None   Social History Narrative  . None   History reviewed. No pertinent family history. Allergies  Allergen Reactions  . Cymbalta [Duloxetine Hcl] Swelling    Swelling in hands and feet  . Lyrica [Pregabalin] Swelling    Hands and feet swell  . Nitrofuran Derivatives Nausea Only   Prior to Admission medications   Medication Sig Start Date End Date Taking? Authorizing Provider  acetaminophen (TYLENOL) 650 MG CR tablet Take 650-1,300 mg by mouth every 8 (eight) hours as needed for pain.   Yes [provider]  alendronate (FOSAMAX) 70 MG tablet Take 70 mg by mouth every Sunday. 12/02/16  Yes [provider]  aspirin EC 81 MG tablet Take 81 mg by mouth daily.   Yes [provider]  Cholecalciferol (VITAMIN D-3) 5000 units TABS  Take 5,000 Units by mouth daily.   Yes [provider]  cyclobenzaprine (FLEXERIL) 5 MG tablet Take 5-10 mg by mouth 3 (three) times daily as needed. For muscles spasms. 12/20/16  Yes [provider]  ferrous sulfate 325 (65 FE) MG tablet Take 325 mg by mouth at bedtime.   Yes [provider]  lisinopril (PRINIVIL,ZESTRIL) 5 MG tablet Take 5 mg by mouth daily. 12/27/16  Yes [provider]  Multiple Minerals-Vitamins (CAL-MAG ZINC II PO) Take 1 tablet by mouth 2 (two) times daily.   Yes [provider]  Multiple Vitamin (MULTIVITAMIN WITH MINERALS) TABS tablet Take 2 tablets by mouth daily. Gummy Adult multivitamin Complete   Yes [provider]  Omega-3 Fatty Acids (FISH OIL) 1200 MG CAPS Take 1,200 mg by mouth daily.   Yes [provider]  pravastatin (PRAVACHOL) 10 MG tablet Take 10 mg by mouth at bedtime.    Yes [provider]  ranitidine (ZANTAC) 75 MG tablet Take 75 mg by mouth daily.   Yes [provider]  vitamin B-12 (CYANOCOBALAMIN) 1000 MCG tablet Take 1,000 mcg by mouth daily.   Yes [provider]     Positive ROS: All other systems have been reviewed and were otherwise negative with the exception of those mentioned in the HPI and as above.  Physical Exam:  General: Alert, no acute distress Cardiovascular: No pedal edema Respiratory: No cyanosis, no use of accessory musculature GI: No organomegaly, abdomen is soft and non-tender Skin: No lesions in the area of chief complaint Neurologic: Sensation intact distally Psychiatric: Patient is competent for consent with normal mood and affect Lymphatic: No axillary or cervical lymphadenopathy  MUSCULOSKELETAL: R shoulder: AFE 90, 4/5 cuff, IR pocket, wwp hand, skin intact  Assessment: DJD RIGHT SHOULDER  Plan: Plan for Procedure(s): TOTAL SHOULDER ARTHROPLASTY  The risks benefits and alternatives were discussed with the patient including  but not limited to the risks of nonoperative treatment, versus surgical intervention including infection, bleeding, nerve injury,  blood clots, cardiopulmonary complications, morbidity, mortality, among others, and they were willing to proceed.   Hiram Gash, MD  01/16/2017 6:42 AM

## 2017-01-16 NOTE — Addendum Note (Signed)
Addendum  created 01/16/17 1201 by Imagene Riches, CRNA   Anesthesia Attestations filed

## 2017-01-16 NOTE — Progress Notes (Signed)
RN called RT regarding patient wearing CPAP at night. RT explained there was no order in her chart for CPAP and that the doctor would need to put in a order. RT will continue to monitor.

## 2017-01-16 NOTE — Anesthesia Postprocedure Evaluation (Signed)
Anesthesia Post Note  Patient: Alejandra Greene  Procedure(s) Performed: REVERSE TOTAL SHOULDER ARTHROPLASTY (Right )     Patient location during evaluation: PACU Anesthesia Type: General Level of consciousness: awake and alert Pain management: pain level controlled Vital Signs Assessment: post-procedure vital signs reviewed and stable Respiratory status: spontaneous breathing, nonlabored ventilation, respiratory function stable and patient connected to nasal cannula oxygen Cardiovascular status: blood pressure returned to baseline and stable Postop Assessment: no apparent nausea or vomiting Anesthetic complications: no    Last Vitals:  Vitals:   01/16/17 1134 01/16/17 1138  BP: (!) 107/54   Pulse: 73   Resp: 13   Temp:  (!) 36.4 C  SpO2: 96%     Last Pain:  Vitals:   01/16/17 1138  PainSc: [place] French Kendra

## 2017-01-16 NOTE — Transfer of Care (Signed)
Immediate Anesthesia Transfer of Care Note  Patient: Alejandra Greene  Procedure(s) Performed: REVERSE TOTAL SHOULDER ARTHROPLASTY (Right )  Patient Location: PACU  Anesthesia Type:GA combined with regional for post-op pain  Level of Consciousness: drowsy and patient cooperative  Airway & Oxygen Therapy: Patient Spontanous Breathing and Patient connected to nasal cannula oxygen  Post-op Assessment: Report given to RN and Post -op Vital signs reviewed and stable  Post vital signs: Reviewed and stable  Last Vitals:  Vitals:   01/16/17 0643 01/16/17 0710  BP: 128/62   Pulse:    Resp:    Temp:  36.7 C  SpO2:      Last Pain:  Vitals:   01/16/17 0659  PainSc: 5       Patients Stated Pain Goal: 5 (88/89/16 9450)  Complications: No apparent anesthesia complications

## 2017-01-16 NOTE — Anesthesia Procedure Notes (Signed)
Procedure Name: Intubation Date/Time: 01/16/2017 8:40 AM Performed by: Imagene Riches Pre-anesthesia Checklist: Patient identified, Emergency Drugs available, Suction available and Patient being monitored Patient Re-evaluated:Patient Re-evaluated prior to induction Oxygen Delivery Method: Circle system utilized Preoxygenation: Pre-oxygenation with 100% oxygen Induction Type: IV induction Ventilation: Mask ventilation without difficulty and Oral airway inserted - appropriate to patient size Laryngoscope Size: Miller and 2 Grade View: Grade I Tube type: Oral Tube size: 7.0 mm Number of attempts: 1 Airway Equipment and Method: Stylet Placement Confirmation: ETT inserted through vocal cords under direct vision,  positive ETCO2 and breath sounds checked- equal and bilateral Secured at: 22 cm Tube secured with: Tape Dental Injury: Teeth and Oropharynx as per pre-operative assessment

## 2017-01-16 NOTE — Anesthesia Procedure Notes (Signed)
Anesthesia Regional Block: Interscalene brachial plexus block   Pre-Anesthetic Checklist: ,, timeout performed, Correct Patient, Correct Site, Correct Laterality, Correct Procedure, Correct Position, site marked, Risks and benefits discussed,  Surgical consent,  Pre-op evaluation,  At surgeon's request and post-op pain management  Laterality: Right  Prep: chloraprep       Needles:  Injection technique: Single-shot  Needle Type: Echogenic Needle     Needle Length: 5cm  Needle Gauge: 21     Additional Needles:   Narrative:  Start time: 01/16/2017 8:04 AM End time: 01/16/2017 8:07 AM Injection made incrementally with aspirations every 5 mL.  Performed by: Personally  Anesthesiologist: Renold Don E  Additional Notes: No pain on injection. No increased resistance to injection. Injection made in 5cc increments. Good needle visualization. Patient tolerated the procedure well.

## 2017-01-16 NOTE — Progress Notes (Signed)
Patient arrived to room from PACU. VSS. Pt denied pain at this time. No other distress noted. Will continue to monitor.  Ave Filter, RN

## 2017-01-17 MED ORDER — BISACODYL 5 MG PO TBEC: 5.0000 mg | DELAYED_RELEASE_TABLET | Freq: Every day | ORAL | 0 refills | Status: AC | PRN

## 2017-01-17 MED ORDER — ACETAMINOPHEN 500 MG PO TABS: 1000.0000 mg | ORAL_TABLET | Freq: Three times a day (TID) | ORAL | 0 refills | Status: AC

## 2017-01-17 MED ORDER — OXYCODONE HCL 5 MG PO TABS: ORAL_TABLET | ORAL | 0 refills | Status: AC

## 2017-01-17 MED ORDER — ONDANSETRON HCL 4 MG PO TABS: 4.0000 mg | ORAL_TABLET | Freq: Three times a day (TID) | ORAL | 1 refills | Status: AC | PRN

## 2017-01-17 MED ORDER — NAPROXEN 250 MG PO TABS: 250.0000 mg | ORAL_TABLET | Freq: Two times a day (BID) | ORAL | 0 refills | Status: AC

## 2017-01-17 MED ORDER — OMEPRAZOLE 20 MG PO CPDR: 20.0000 mg | DELAYED_RELEASE_CAPSULE | Freq: Every day | ORAL | 0 refills | Status: AC

## 2017-01-17 NOTE — Progress Notes (Signed)
Pt request to wear her CPAP at night   No order for CPAP Ortho surgeon on call page x 2  No orders awaiting MD to call back.

## 2017-01-17 NOTE — Progress Notes (Signed)
OT Note - Addendum    01/17/17 1200  OT Visit Information  Last OT Received On 01/17/17  OT Time Calculation  OT Start Time (ACUTE ONLY) 0939  OT Stop Time (ACUTE ONLY) 1002  OT Time Calculation (min) 23 min  OT General Charges  $OT Visit 1 Visit  OT Evaluation  $OT Eval Low Complexity 1 Low  OT Treatments  $Self Care/Home Management  8-22 mins  [facility], OT/L  [phone] 01/17/2017

## 2017-01-17 NOTE — Therapy (Signed)
Occupational Therapy Evaluation and Discharge Patient Details Name: Alejandra Greene MRN: [id number] DOB: 18-Aug-1945 Today's Date: 01/17/2017    History of Present Illness Pt is a 71 y.o. female s/p right reverse total shoulder arthroplasty. PMH includes arthritis, complication of anesthesia, fibromyalgia, HTN, IBS, osteoporosis, PONV, sleep apnea. PSH: abdominal hysterectomy, carpal tunnel release, plantar fascia surgery, right rotator cuff repair.    Clinical Impression   Pt reports being independent in ADLs, IADLs, and works part time PTA. Currently, pt requires min assist for UB bathing/dressing and modified independent for all other ADLs and functional mobility. Pt reports family is available intermittently to assist as needed upon d/c. Pt received all acute OT education and was able to return demonstration of compensatory techniques for ADLs with per MD shoulder protocol. Pt safe to d/c home with intermittent supervision. No further acute OT needs at this time. OT signing off.     Follow Up Recommendations  DC plan and follow up therapy as arranged by surgeon;Supervision - Intermittent    Equipment Recommendations  None recommended by OT    Recommendations for Other Services       Precautions / Restrictions Precautions Precautions: Shoulder Type of Shoulder Precautions: pendulums; can use arm for self feeding; computer work per pt report from MD Shoulder Interventions: Shoulder sling/immobilizer;Off for dressing/bathing/exercises Precaution Booklet Issued: Yes (comment) Required Braces or Orthoses: Sling Restrictions Weight Bearing Restrictions: Yes Other Position/Activity Restrictions: NWB RUE      Mobility Bed Mobility Overal bed mobility: Modified Independent                Transfers Overall transfer level: Modified independent Equipment used: None                  Balance Overall balance assessment: No apparent balance deficits (not formally  assessed)                                         ADL either performed or assessed with clinical judgement   ADL Overall ADL's : Needs assistance/impaired Eating/Feeding: Modified independent   Grooming: Modified independent;Standing   Upper Body Bathing: Minimal assistance;Sitting Upper Body Bathing Details (indicate cue type and reason): Pt educated on technique to bathe RUE.  Lower Body Bathing: Modified independent;Sit to/from stand   Upper Body Dressing : Minimal assistance;Standing Upper Body Dressing Details (indicate cue type and reason): Pt educated on technique to don UB clothing. Pt able to don shirt with verbal cues for technique. Min assist to don sling and UB clothing.  Lower Body Dressing: Modified independent;Sit to/from stand   Toilet Transfer: [place] and Hygiene: Modified Furniture conservator/restorer Details (indicate cue type and reason): Pt advised to have spouse available to assist initially upon d/c for safety  Functional mobility during ADLs: Modified independent General ADL Comments: Pt educated on compensatory techniques to complete ADLs with shoulder precautions.      Vision         Perception     Praxis      Pertinent Vitals/Pain Pain Assessment: No/denies pain     Hand Dominance Right   Extremity/Trunk Assessment Upper Extremity Assessment Upper Extremity Assessment: RUE deficits/detail RUE Deficits / Details: s/p right reverse total shoulder arthroplasty  RUE: Unable to fully assess due to immobilization   Lower Extremity Assessment Lower Extremity Assessment: Overall WFL for tasks  assessed   Cervical / Trunk Assessment Cervical / Trunk Assessment: Normal   Communication Communication Communication: No difficulties   Cognition Arousal/Alertness: Awake/alert Behavior During Therapy: WFL for tasks assessed/performed Overall Cognitive Status: Within Functional  Limits for tasks assessed                                     General Comments  Pts husband present during evaluation. Pt presents with good balance with RUE in sling and good activity tolerance to RUE exercises . Pt advised to complete exercises 2-3 times a day. Written HEP provided. Pt advised to limit use of RUE. Educated pt on use of ice as a method of pain control.     Exercises Exercises: Shoulder;Other exercises Shoulder Exercises Pendulum Exercise: PROM;10 reps;Standing Elbow Flexion: AROM;10 reps;Seated Elbow Extension: AROM;10 reps;Seated Wrist Flexion: AROM;10 reps;Seated Wrist Extension: AROM;10 reps;Seated Digit Composite Flexion: AROM;10 reps;Seated Composite Extension: AROM;10 reps;Seated Other Exercises Other Exercises: scapular tightening    Shoulder Instructions Shoulder Instructions Donning/doffing shirt without moving shoulder: Minimal assistance Method for sponge bathing under operated UE: Supervision/safety Donning/doffing sling/immobilizer: Minimal assistance Correct positioning of sling/immobilizer: Supervision/safety Pendulum exercises (written home exercise program): Supervision/safety ROM for elbow, wrist and digits of operated UE: Supervision/safety Sling wearing schedule (on at all times/off for ADL's): Supervision/safety Proper positioning of operated UE when showering: Supervision/safety Positioning of UE while sleeping: Minimal assistance    Home Living Family/patient expects to be discharged to:: Private residence Living Arrangements: Spouse/significant other Available Help at Discharge: Family Type of Home: House Home Access: Stairs to enter Technical brewer of Steps: 1 Entrance Stairs-Rails: Right;Left;Can reach both Home Layout: Two level;Able to live on main level with bedroom/bathroom     Bathroom Shower/Tub: Tub/shower unit;Curtain   Bathroom Toilet: Standard     Home Equipment: Grab bars - tub/shower;Bedside  commode;Tub bench          Prior Functioning/Environment Level of Independence: Independent        Comments: 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   OT Goals(Current goals can be found in the care plan section) Acute Rehab OT Goals Patient Stated Goal: To go home  OT Goal Formulation: All assessment and education complete, DC therapy  OT Frequency:     Barriers to D/C:            Co-evaluation              AM-PAC PT "6 Clicks" Daily Activity     Outcome Measure Help from another person eating meals?: None Help from another person taking care of personal grooming?: A Little Help from another person toileting, which includes using toliet, bedpan, or urinal?: None Help from another person bathing (including washing, rinsing, drying)?: A Little Help from another person to put on and taking off regular upper body clothing?: A Little Help from another person to put on and taking off regular lower body clothing?: A Little 6 Click Score: 20   End of Session Equipment Utilized During Treatment: Other (comment) (Sling) Nurse Communication: Mobility status  Activity Tolerance: Patient tolerated treatment well Patient left: with family/visitor present (Pt standing in room preparing for d/c .)  OT Visit Diagnosis: Other abnormalities of  gait and mobility (R26.89)                Time: 0488-8916 OT Time Calculation (min): 23 min Charges:    G-Codes:     Boykin Peek, OTS [phone]   Boykin Peek 01/17/2017, 12:42 PM

## 2017-01-17 NOTE — Discharge Summary (Signed)
Physician Discharge Summary  Patient ID: Alejandra Greene MRN: [id number] DOB/AGE: Nov 20, 1945 71 y.o.  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  Tolerated procedure well.  Was kept for monitoring overnight for pain control and medical monitoring postop and was found to be stable for DC home the morning after surgery.  Patient was instructed on specific activity restrictions and all questions were answered.   Consults: None  Significant Diagnostic Studies: none  Treatments: Surgery: R reverse total shoulder arthroplasty  Discharge Exam: Blood pressure (!) 124/59, pulse 68, temperature 97.6 F (36.4 C), temperature source Oral, resp. rate 16, height 5\' 4"  (1.626 m), weight 91.6 kg (201 lb 15.8 oz), SpO2 97 %. General appearance: cooperative [id number] CDI and sling well fitting,  full and painless ROM throughout hand with DPC of 0. + Motor in  AIN, PIN, Ulnar distributions. Axillary nerve sensation preserved and symmetric.  Sensation intact in medial, radial, and ulnar distributions. Well perfused digits.     Disposition: Final discharge disposition not confirmed  Discharge Instructions    Call MD for:  persistant nausea and vomiting    Complete by:  As directed    Call MD for:  redness, tenderness, or signs of infection (pain, swelling, redness, odor or green/yellow discharge around incision site)    Complete by:  As directed    Call MD for:  severe uncontrolled pain    Complete by:  As directed    Diet - low sodium heart healthy    Complete by:  As directed    Discharge instructions    Complete by:  As directed    Ophelia Charter MD, MPH [employer] [address] [phone] (tel)   [phone]  (fax)   M[place] may leave the operative dressing in place until your follow-up appointment. KEEP THE INCISIONS CLEAN AND DRY. Use the Cryocuff, GameReady or Ice as often as possible for the first 3-4 days, then as needed for pain relief.  You may shower on Post-Op Day #2. The dressing is water resistant but do not scrub it as it may start to peel up.  You may remove the sling for showering, but keep a water resistant pillow or another sling on under the arm to keep both the elbow and shoulder away from the body (mimicking the abduction sling). Gently pat the area dry. Do not soak the shoulder in water. Do not go swimming in the pool or ocean until your sutures are removed.  EXERCISES Wear the sling at all times except when doing your exercises. You may remove the sling for showering, but keep the arm across the chest. Accidental/Purposeful External Rotation is to be avoided at all costs for the first month. Please perform the exercises as demonstrated in the Home Exercise Program 2-3 times daily:   Pendulums  Scapular Tightening  Elbow / Hand / Wrist  Range of Motion Exercises Grip Strengthening POST-OP A multi-modal approach will be used to treat your pain. Oxycodone - This is a strong narcotic, to be used only on an "as needed" basis for pain. Naproxen - An anti-inflammatory medication, is to be used twice a day with breakfast and dinner, for the first 14 days after surgery. Acetaminophen - A non-narcotic  pain medicine.  Use 1000mg  three times a day for the first 14 days after surgery If you have any adverse effects with the medications, please call our office.  FOLLOW-UP If you develop a Fever (>101.5), Redness or Drainage from the surgical incision site, please call our office to arrange for an evaluation. Please call the office to schedule a follow-up appointment for a wound check, 7-10 days  post-operatively.    IF YOU HAVE ANY QUESTIONS, PLEASE FEEL FREE TO CALL OUR OFFICE.   HELPFUL INFORMATION  Your arm will be in a sling following surgery. You will be in this sling for the next 3-4 weeks.  I will let you know the exact duration at your follow-up visit.  You may be more comfortable sleeping in a semi-seated position the first few nights following surgery.  Keep a pillow propped under the elbow and forearm for comfort.  If you have a recliner type of chair it might be beneficial.  If not that is fine too, but it would be helpful to sleep propped up with pillows behind your operated shoulder as well under your elbow and forearm.  This will reduce pulling on the suture lines.  We suggest you use the pain medication the first night prior to going to bed, in order to ease any pain when the anesthesia wears off. You should avoid taking pain medications on an empty stomach as it will make you nauseous.  Do not drink alcoholic beverages or take illicit drugs when taking pain medications.  In most states it is against the law to drive while your arm is in a sling. And certainly against the law to drive while taking narcotics.  You may return to work/school in the next couple of days when you feel up to it. Desk work and typing in the sling is fine.  When dressing, put your operative arm in the sleeve first.  When getting undressed, take your operative arm out last.  Loose fitting, button-down shirts are recommended.  Pain medication may make you constipated.  Below are a few solutions to try in this order: Decrease the amount of pain medication if you aren't having pain. Drink lots of decaffeinated fluids. Drink prune juice and/or each dried prunes  If the first 3 don't work start with additional solutions Take Colace - an over-the-counter stool softener Take Senokot - an over-the-counter laxative Take Miralax - a stronger over-the-counter laxative   Increase activity slowly     Complete by:  As directed      Allergies as of 01/17/2017      Reactions   Cymbalta [duloxetine Hcl] Swelling   Swelling in hands and feet   Lyrica [pregabalin] Swelling   Hands and feet swell   Nitrofuran Derivatives Nausea Only      Medication List    STOP taking these medications   acetaminophen 650 MG CR tablet Commonly known as:  TYLENOL Replaced by:  acetaminophen 500 MG tablet     TAKE these medications   acetaminophen 500 MG tablet Commonly known as:  TYLENOL Take 2 tablets (1,000 mg total) by mouth every 8 (eight) hours. Replaces:  acetaminophen 650 MG CR tablet   alendronate 70 MG tablet Commonly known as:  FOSAMAX Take 70 mg by mouth every Sunday.   aspirin EC 81 MG tablet Take 81 mg by mouth daily.   bisacodyl 5 MG EC tablet Commonly known as:  DULCOLAX Take 1 tablet (5 mg total) by mouth daily as needed for  moderate constipation.   CAL-MAG ZINC II PO Take 1 tablet by mouth 2 (two) times daily.   cyclobenzaprine 5 MG tablet Commonly known as:  FLEXERIL Take 5-10 mg by mouth 3 (three) times daily as needed. For muscles spasms.   ferrous sulfate 325 (65 FE) MG tablet Take 325 mg by mouth at bedtime.   Fish Oil 1200 MG Caps Take 1,200 mg by mouth daily.   lisinopril 5 MG tablet Commonly known as:  PRINIVIL,ZESTRIL Take 5 mg by mouth daily.   multivitamin with minerals Tabs tablet Take 2 tablets by mouth daily. Gummy Adult multivitamin Complete   naproxen 250 MG tablet Commonly known as:  NAPROSYN Take 1 tablet (250 mg total) by mouth 2 (two) times daily with a meal.   omeprazole 20 MG capsule Commonly known as:  PRILOSEC Take 1 capsule (20 mg total) by mouth daily.   ondansetron 4 MG tablet Commonly known as:  ZOFRAN Take 1 tablet (4 mg total) by mouth every 8 (eight) hours as needed for nausea or vomiting.   oxyCODONE 5 MG immediate release tablet Commonly known as:  Oxy IR/ROXICODONE Take 1-2 pills every 6 hrs as needed for pain    pravastatin 10 MG tablet Commonly known as:  PRAVACHOL Take 10 mg by mouth at bedtime.   ranitidine 75 MG tablet Commonly known as:  ZANTAC Take 75 mg by mouth daily.   vitamin B-12 1000 MCG tablet Commonly known as:  CYANOCOBALAMIN Take 1,000 mcg by mouth daily.   Vitamin D-3 5000 units Tabs Take 5,000 Units by mouth daily.        Signed: Hiram Gash 01/17/2017, 8:08 AM

## 2017-01-17 NOTE — Progress Notes (Signed)
Discharge instructions discussed with patient and husband all medications and activity discussed.  Patient states that she has an upcoming appointment already scheduled.  Pain medication given prior to discharge.  Patient and husband state that they understand all instruction, no further questions or concerns at this time.

## 2017-01-21 ENCOUNTER — Encounter (HOSPITAL_COMMUNITY): Payer: Self-pay | Admitting: Orthopaedic Surgery

## 2017-01-25 DIAGNOSIS — M19011 Primary osteoarthritis, right shoulder: Secondary | ICD-10-CM | POA: Diagnosis not present

## 2017-01-31 DIAGNOSIS — M25511 Pain in right shoulder: Secondary | ICD-10-CM | POA: Diagnosis not present

## 2017-01-31 DIAGNOSIS — M19011 Primary osteoarthritis, right shoulder: Secondary | ICD-10-CM | POA: Diagnosis not present

## 2017-02-05 DIAGNOSIS — M25511 Pain in right shoulder: Secondary | ICD-10-CM | POA: Diagnosis not present

## 2017-02-05 DIAGNOSIS — M19011 Primary osteoarthritis, right shoulder: Secondary | ICD-10-CM | POA: Diagnosis not present

## 2017-02-06 DIAGNOSIS — M19011 Primary osteoarthritis, right shoulder: Secondary | ICD-10-CM | POA: Diagnosis not present

## 2017-02-06 DIAGNOSIS — M25511 Pain in right shoulder: Secondary | ICD-10-CM | POA: Diagnosis not present

## 2017-02-11 DIAGNOSIS — M25511 Pain in right shoulder: Secondary | ICD-10-CM | POA: Diagnosis not present

## 2017-02-11 DIAGNOSIS — M19011 Primary osteoarthritis, right shoulder: Secondary | ICD-10-CM | POA: Diagnosis not present

## 2017-02-14 DIAGNOSIS — M25511 Pain in right shoulder: Secondary | ICD-10-CM | POA: Diagnosis not present

## 2017-02-14 DIAGNOSIS — M19011 Primary osteoarthritis, right shoulder: Secondary | ICD-10-CM | POA: Diagnosis not present

## 2017-02-19 DIAGNOSIS — M25511 Pain in right shoulder: Secondary | ICD-10-CM | POA: Diagnosis not present

## 2017-02-19 DIAGNOSIS — M19011 Primary osteoarthritis, right shoulder: Secondary | ICD-10-CM | POA: Diagnosis not present

## 2017-02-20 ENCOUNTER — Encounter: Payer: Self-pay | Admitting: Sports Medicine

## 2017-02-20 ENCOUNTER — Ambulatory Visit (INDEPENDENT_AMBULATORY_CARE_PROVIDER_SITE_OTHER): Payer: Medicare Other | Admitting: Sports Medicine

## 2017-02-20 DIAGNOSIS — G588 Other specified mononeuropathies: Secondary | ICD-10-CM

## 2017-02-20 DIAGNOSIS — B351 Tinea unguium: Secondary | ICD-10-CM | POA: Diagnosis not present

## 2017-02-20 DIAGNOSIS — M79676 Pain in unspecified toe(s): Secondary | ICD-10-CM

## 2017-02-20 NOTE — Progress Notes (Signed)
Subjective: Alejandra Greene is a 71 y.o. female patient seen today in office with complaint of painful thickened and elongated toenails; unable to trim. Patient states that it seems like they're starting to grow in. Patient denies changes with medical history or medications since last visit, Reports that she had a total shoulder done on the right and area has healed well and is in PT. Patient has no other pedal complaints at this time.   Patient Active Problem List   Diagnosis Date Noted  . 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 alendronate (FOSAMAX) 70 MG tablet Take 70 mg by mouth every Sunday.    Marland Kitchen aspirin EC 81 MG tablet Take 81 mg by mouth daily.    . Cholecalciferol (VITAMIN D-3) 5000 units TABS Take 5,000 Units by mouth daily.    . cyclobenzaprine (FLEXERIL) 5 MG tablet Take 5-10 mg by mouth 3 (three) times daily as needed. For muscles spasms.    . ferrous sulfate 325 (65 FE) MG tablet Take 325 mg by mouth at bedtime.    Marland Kitchen lisinopril (PRINIVIL,ZESTRIL) 5 MG tablet Take 5 mg by mouth daily.    . Multiple Minerals-Vitamins (CAL-MAG ZINC II PO) Take 1 tablet by mouth 2 (two) times daily.    . Multiple Vitamin (MULTIVITAMIN WITH MINERALS) TABS tablet Take 2 tablets by mouth daily. Gummy Adult multivitamin Complete    . Omega-3 Fatty Acids (FISH OIL) 1200 MG CAPS Take 1,200 mg by mouth daily.    Marland Kitchen omeprazole (PRILOSEC) 20 MG capsule Take 1 capsule (20 mg total) by mouth daily. 14 capsule 0  . pravastatin (PRAVACHOL) 10 MG tablet Take 10 mg by mouth at bedtime.     . ranitidine (ZANTAC) 75 MG tablet Take 75 mg by mouth daily.    . vitamin B-12 (CYANOCOBALAMIN) 1000 MCG tablet Take 1,000 mcg by mouth daily.     No current facility-administered medications on file prior to visit.     Allergies  Allergen Reactions  . Cymbalta [Duloxetine Hcl] Swelling   Swelling in hands and feet  . Lyrica [Pregabalin] Swelling    Hands and feet swell  . Nitrofuran Derivatives Nausea Only    Objective: Physical Exam  General: Well developed, nourished, no acute distress, awake, alert and oriented x 3  Vascular: Dorsalis pedis artery 1/4 bilateral, Posterior tibial artery 1/4 bilateral, skin temperature warm to warm proximal to distal bilateral lower extremities, mild varicosities, pedal hair present bilateral.  Neurological: Gross sensation present via light touch bilateral. Subjective burning, tingling, numbness to feet.  Dermatological: Skin is warm, dry, and supple bilateral, Nails 1-10 are tender, long, thick, and discolored with mild subungal debris, no acute ingrowing, no webspace macerations present bilateral, no open lesions present bilateral, no callus/corns/hyperkeratotic tissue present bilateral. No signs of infection bilateral.  Musculoskeletal: Aymptomatic hammertoe boney deformities noted bilateral. Muscular strength within normal limits without pain on range of motion. No pain with calf compression bilateral.  Assessment and Plan:  Problem List Items Addressed This Visit    None    Visit Diagnoses    Onychomycosis    -  Primary   Pain of toe, unspecified laterality       Other mononeuropathy          -Examined patient.  -Discussed treatment options for painful mycotic nails. -Mechanically debrided and reduced mycotic nails with sterile nail nipper and dremel  nail file without incident. -Patient to return in 3 months for follow up evaluation or sooner if symptoms worsen.  Landis Martins, DPM

## 2017-02-21 DIAGNOSIS — M19011 Primary osteoarthritis, right shoulder: Secondary | ICD-10-CM | POA: Diagnosis not present

## 2017-02-21 DIAGNOSIS — M25511 Pain in right shoulder: Secondary | ICD-10-CM | POA: Diagnosis not present

## 2017-02-25 DIAGNOSIS — M25511 Pain in right shoulder: Secondary | ICD-10-CM | POA: Diagnosis not present

## 2017-02-25 DIAGNOSIS — M19011 Primary osteoarthritis, right shoulder: Secondary | ICD-10-CM | POA: Diagnosis not present

## 2017-02-26 DIAGNOSIS — M19011 Primary osteoarthritis, right shoulder: Secondary | ICD-10-CM | POA: Diagnosis not present

## 2017-02-26 DIAGNOSIS — M25511 Pain in right shoulder: Secondary | ICD-10-CM | POA: Diagnosis not present

## 2017-02-27 DIAGNOSIS — M4722 Other spondylosis with radiculopathy, cervical region: Secondary | ICD-10-CM | POA: Diagnosis not present

## 2017-02-27 DIAGNOSIS — M4712 Other spondylosis with myelopathy, cervical region: Secondary | ICD-10-CM | POA: Diagnosis not present

## 2017-03-05 DIAGNOSIS — M25511 Pain in right shoulder: Secondary | ICD-10-CM | POA: Diagnosis not present

## 2017-03-05 DIAGNOSIS — M19011 Primary osteoarthritis, right shoulder: Secondary | ICD-10-CM | POA: Diagnosis not present

## 2017-03-07 DIAGNOSIS — M25511 Pain in right shoulder: Secondary | ICD-10-CM | POA: Diagnosis not present

## 2017-03-07 DIAGNOSIS — M19011 Primary osteoarthritis, right shoulder: Secondary | ICD-10-CM | POA: Diagnosis not present

## 2017-03-12 DIAGNOSIS — M19011 Primary osteoarthritis, right shoulder: Secondary | ICD-10-CM | POA: Diagnosis not present

## 2017-03-12 DIAGNOSIS — M25511 Pain in right shoulder: Secondary | ICD-10-CM | POA: Diagnosis not present

## 2017-03-20 DIAGNOSIS — M25511 Pain in right shoulder: Secondary | ICD-10-CM | POA: Diagnosis not present

## 2017-03-20 DIAGNOSIS — M19011 Primary osteoarthritis, right shoulder: Secondary | ICD-10-CM | POA: Diagnosis not present

## 2017-03-26 DIAGNOSIS — M19011 Primary osteoarthritis, right shoulder: Secondary | ICD-10-CM | POA: Diagnosis not present

## 2017-03-26 DIAGNOSIS — M25511 Pain in right shoulder: Secondary | ICD-10-CM | POA: Diagnosis not present

## 2017-04-11 DIAGNOSIS — M19011 Primary osteoarthritis, right shoulder: Secondary | ICD-10-CM | POA: Diagnosis not present

## 2017-04-24 DIAGNOSIS — G4733 Obstructive sleep apnea (adult) (pediatric): Secondary | ICD-10-CM | POA: Diagnosis not present

## 2017-04-24 DIAGNOSIS — R5383 Other fatigue: Secondary | ICD-10-CM | POA: Diagnosis not present

## 2017-04-25 DIAGNOSIS — G4733 Obstructive sleep apnea (adult) (pediatric): Secondary | ICD-10-CM | POA: Diagnosis not present

## 2017-05-23 ENCOUNTER — Ambulatory Visit: Payer: Medicare Other | Admitting: Sports Medicine

## 2017-06-19 ENCOUNTER — Encounter: Payer: Self-pay | Admitting: Sports Medicine

## 2017-06-19 ENCOUNTER — Ambulatory Visit (INDEPENDENT_AMBULATORY_CARE_PROVIDER_SITE_OTHER): Payer: Medicare Other | Admitting: Sports Medicine

## 2017-06-19 DIAGNOSIS — G588 Other specified mononeuropathies: Secondary | ICD-10-CM | POA: Diagnosis not present

## 2017-06-19 DIAGNOSIS — B351 Tinea unguium: Secondary | ICD-10-CM | POA: Diagnosis not present

## 2017-06-19 DIAGNOSIS — M79676 Pain in unspecified toe(s): Secondary | ICD-10-CM | POA: Diagnosis not present

## 2017-06-19 NOTE — Progress Notes (Signed)
Subjective: Alejandra Greene is a 72 y.o. female patient seen today in office with complaint of painful thickened and elongated toenails; unable to trim. Patient states that her toenails are starting to grow along with some lifting of the right great toenail and a bit junk of nail sitting on top.  Patient states that it is difficult for her to trim her nails herself due to back and shoulder issues and inability to reach feet to trim them and due to thick nails and the lack of appropriate sensation that she has in both feet.  Patient denies any changes with medical history or medications since last encounter. Patient has no other pedal complaints at this time.   Patient Active Problem List   Diagnosis Date Noted  . 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 alendronate (FOSAMAX) 70 MG tablet Take 70 mg by mouth every Sunday.    Marland Kitchen aspirin EC 81 MG tablet Take 81 mg by mouth daily.    . Cholecalciferol (VITAMIN D-3) 5000 units TABS Take 5,000 Units by mouth daily.    . cyclobenzaprine (FLEXERIL) 5 MG tablet Take 5-10 mg by mouth 3 (three) times daily as needed. For muscles spasms.    . ferrous sulfate 325 (65 FE) MG tablet Take 325 mg by mouth at bedtime.    Marland Kitchen lisinopril (PRINIVIL,ZESTRIL) 5 MG tablet Take 5 mg by mouth daily.    . Multiple Minerals-Vitamins (CAL-MAG ZINC II PO) Take 1 tablet by mouth 2 (two) times daily.    . Multiple Vitamin (MULTIVITAMIN WITH MINERALS) TABS tablet Take 2 tablets by mouth daily. Gummy Adult multivitamin Complete    . Omega-3 Fatty Acids (FISH OIL) 1200 MG CAPS Take 1,200 mg by mouth daily.    Marland Kitchen omeprazole (PRILOSEC) 20 MG capsule Take 1 capsule (20 mg total) by mouth daily. 14 capsule 0  . pravastatin (PRAVACHOL) 10 MG tablet Take 10 mg by mouth at bedtime.     . ranitidine (ZANTAC) 75 MG tablet Take 75 mg by mouth daily.    . vitamin B-12  (CYANOCOBALAMIN) 1000 MCG tablet Take 1,000 mcg by mouth daily.     No current facility-administered medications on file prior to visit.     Allergies  Allergen Reactions  . Cymbalta [Duloxetine Hcl] Swelling    Swelling in hands and feet  . Lyrica [Pregabalin] Swelling    Hands and feet swell  . Nitrofuran Derivatives Nausea Only    Objective: Physical Exam  General: Well developed, nourished, no acute distress, awake, alert and oriented x 3  Vascular: Dorsalis pedis artery 1/4 bilateral, Posterior tibial artery 1/4 bilateral, skin temperature warm to warm proximal to distal bilateral lower extremities, mild varicosities, pedal hair present bilateral.  Neurological: Gross sensation present via light touch bilateral. Subjective burning, tingling, numbness to feet.  Dermatological: Skin is warm, dry, and supple bilateral, Nails 1-10 are tender, long, thick, and discolored with mild subungal debris, no acute ingrowing, no webspace macerations present bilateral, no open lesions present bilateral, no callus/corns/hyperkeratotic tissue present bilateral. No signs of infection bilateral.  Musculoskeletal: Aymptomatic hammertoe boney deformities noted bilateral. Muscular strength within normal limits without pain on range of motion. No pain with calf compression bilateral.  Assessment and Plan:  Problem List Items Addressed This Visit    None    Visit Diagnoses    Onychomycosis    -  Primary  Pain of toe, unspecified laterality       Other mononeuropathy          -Examined patient.  -Discussed treatment options for painful mycotic nails. -Mechanically debrided and reduced mycotic nails with sterile nail nipper and dremel nail file without incident. -ABN signed this visit -Patient to return in 3 months for follow up evaluation or sooner if symptoms worsen.  Landis Martins, DPM

## 2017-06-27 DIAGNOSIS — G4733 Obstructive sleep apnea (adult) (pediatric): Secondary | ICD-10-CM | POA: Diagnosis not present

## 2017-06-27 DIAGNOSIS — E782 Mixed hyperlipidemia: Secondary | ICD-10-CM | POA: Diagnosis not present

## 2017-06-27 DIAGNOSIS — R946 Abnormal results of thyroid function studies: Secondary | ICD-10-CM | POA: Diagnosis not present

## 2017-06-27 DIAGNOSIS — G542 Cervical root disorders, not elsewhere classified: Secondary | ICD-10-CM | POA: Diagnosis not present

## 2017-06-27 DIAGNOSIS — M797 Fibromyalgia: Secondary | ICD-10-CM | POA: Diagnosis not present

## 2017-06-27 DIAGNOSIS — R7301 Impaired fasting glucose: Secondary | ICD-10-CM | POA: Diagnosis not present

## 2017-06-27 DIAGNOSIS — I10 Essential (primary) hypertension: Secondary | ICD-10-CM | POA: Diagnosis not present

## 2017-06-27 DIAGNOSIS — N3941 Urge incontinence: Secondary | ICD-10-CM | POA: Diagnosis not present

## 2017-06-27 DIAGNOSIS — Z6834 Body mass index (BMI) 34.0-34.9, adult: Secondary | ICD-10-CM | POA: Diagnosis not present

## 2017-06-28 DIAGNOSIS — G4733 Obstructive sleep apnea (adult) (pediatric): Secondary | ICD-10-CM | POA: Diagnosis not present

## 2017-07-03 DIAGNOSIS — G4733 Obstructive sleep apnea (adult) (pediatric): Secondary | ICD-10-CM | POA: Diagnosis not present

## 2017-08-05 DIAGNOSIS — M810 Age-related osteoporosis without current pathological fracture: Secondary | ICD-10-CM | POA: Diagnosis not present

## 2017-08-05 DIAGNOSIS — M8580 Other specified disorders of bone density and structure, unspecified site: Secondary | ICD-10-CM | POA: Diagnosis not present

## 2017-08-05 DIAGNOSIS — N959 Unspecified menopausal and perimenopausal disorder: Secondary | ICD-10-CM | POA: Diagnosis not present

## 2017-08-05 DIAGNOSIS — Z1231 Encounter for screening mammogram for malignant neoplasm of breast: Secondary | ICD-10-CM | POA: Diagnosis not present

## 2017-08-28 DIAGNOSIS — N3941 Urge incontinence: Secondary | ICD-10-CM | POA: Diagnosis not present

## 2017-08-28 DIAGNOSIS — Z23 Encounter for immunization: Secondary | ICD-10-CM | POA: Diagnosis not present

## 2017-08-28 DIAGNOSIS — M25571 Pain in right ankle and joints of right foot: Secondary | ICD-10-CM | POA: Diagnosis not present

## 2017-08-28 DIAGNOSIS — Z6834 Body mass index (BMI) 34.0-34.9, adult: Secondary | ICD-10-CM | POA: Diagnosis not present

## 2017-08-28 DIAGNOSIS — Z1211 Encounter for screening for malignant neoplasm of colon: Secondary | ICD-10-CM | POA: Diagnosis not present

## 2017-08-28 DIAGNOSIS — Z0001 Encounter for general adult medical examination with abnormal findings: Secondary | ICD-10-CM | POA: Diagnosis not present

## 2017-10-14 ENCOUNTER — Encounter: Payer: Self-pay | Admitting: Gastroenterology

## 2017-10-30 ENCOUNTER — Ambulatory Visit (AMBULATORY_SURGERY_CENTER): Payer: Self-pay | Admitting: *Deleted

## 2017-10-30 VITALS — Ht 66.0 in | Wt 193.8 lb

## 2017-10-30 DIAGNOSIS — Z86010 Personal history of colonic polyps: Secondary | ICD-10-CM

## 2017-10-30 DIAGNOSIS — Z800 Family history of malignant neoplasm of digestive organs: Secondary | ICD-10-CM

## 2017-10-30 NOTE — Progress Notes (Signed)
No egg or soy allergy known to patient   issues with past sedation with any surgeries  or procedures PONV , no intubation problems  No diet pills per patient No home 02 use per patient  No blood thinners per patient  Pt denies issues with constipation  No A fib or A flutter  EMMI video sent to pt's e mail - pt declined

## 2017-11-07 ENCOUNTER — Encounter: Payer: Self-pay | Admitting: Gastroenterology

## 2017-11-07 ENCOUNTER — Ambulatory Visit (AMBULATORY_SURGERY_CENTER): Payer: Medicare Other | Admitting: Gastroenterology

## 2017-11-07 VITALS — BP 124/57 | HR 68 | Temp 98.9°F | Resp 15 | Ht 66.0 in | Wt 193.0 lb

## 2017-11-07 DIAGNOSIS — K621 Rectal polyp: Secondary | ICD-10-CM | POA: Diagnosis not present

## 2017-11-07 DIAGNOSIS — D129 Benign neoplasm of anus and anal canal: Secondary | ICD-10-CM

## 2017-11-07 DIAGNOSIS — G4730 Sleep apnea, unspecified: Secondary | ICD-10-CM | POA: Diagnosis not present

## 2017-11-07 DIAGNOSIS — M797 Fibromyalgia: Secondary | ICD-10-CM | POA: Diagnosis not present

## 2017-11-07 DIAGNOSIS — Z86010 Personal history of colonic polyps: Secondary | ICD-10-CM

## 2017-11-07 DIAGNOSIS — D128 Benign neoplasm of rectum: Secondary | ICD-10-CM

## 2017-11-07 DIAGNOSIS — K589 Irritable bowel syndrome without diarrhea: Secondary | ICD-10-CM | POA: Diagnosis not present

## 2017-11-07 DIAGNOSIS — Z1211 Encounter for screening for malignant neoplasm of colon: Secondary | ICD-10-CM | POA: Diagnosis not present

## 2017-11-07 DIAGNOSIS — I10 Essential (primary) hypertension: Secondary | ICD-10-CM | POA: Diagnosis not present

## 2017-11-07 DIAGNOSIS — K219 Gastro-esophageal reflux disease without esophagitis: Secondary | ICD-10-CM | POA: Diagnosis not present

## 2017-11-07 MED ORDER — SODIUM CHLORIDE 0.9 % IV SOLN: 500.0000 mL | Freq: Once | INTRAVENOUS | Status: AC

## 2017-11-07 NOTE — Patient Instructions (Signed)
Impression/Recommendations:  Polyp handout given to patient. Diverticulosis handout given to patient. Hemorrhoid handout given to patient.  Resume previous diet. Continue present medications.  Repeat colonoscopy for surveillance.  Date to be determined after pathology results reviewed.  Return to GI clinic as needed.  YOU HAD AN ENDOSCOPIC PROCEDURE TODAY AT [facility]:   Refer to the procedure report that was given to you for any specific questions about what was found during the examination.  If the procedure report does not answer your questions, please call your gastroenterologist to clarify.  If you requested that your care partner not be given the details of your procedure findings, then the procedure report has been included in a sealed envelope for you to review at your convenience later.  YOU SHOULD EXPECT: Some feelings of bloating in the abdomen. Passage of more gas than usual.  Walking can help get rid of the air that was put into your GI tract during the procedure and reduce the bloating. If you had a lower endoscopy (such as a colonoscopy or flexible sigmoidoscopy) you may notice spotting of blood in your stool or on the toilet paper. If you underwent a bowel prep for your procedure, you may not have a normal bowel movement for a few days.  Please Note:  You might notice some irritation and congestion in your nose or some drainage.  This is from the oxygen used during your procedure.  There is no need for concern and it should clear up in a day or so.  SYMPTOMS TO REPORT IMMEDIATELY:   Following lower endoscopy (colonoscopy or flexible sigmoidoscopy):  Excessive amounts of blood in the stool  Significant tenderness or worsening of abdominal pains  Swelling of the abdomen that is new, acute  Fever of 100F or higher  For urgent or emergent issues, a gastroenterologist can be reached at any hour by calling [phone].   DIET:  We do recommend a small  meal at first, but then you may proceed to your regular diet.  Drink plenty of fluids but you should avoid alcoholic beverages for 24 hours.  ACTIVITY:  You should plan to take it easy for the rest of today and you should NOT DRIVE or use heavy machinery until tomorrow (because of the sedation medicines used during the test).    FOLLOW UP: Our staff will call the number listed on your records the next business day following your procedure to check on you and address any questions or concerns that you may have regarding the information given to you following your procedure. If we do not reach you, we will leave a message.  However, if you are feeling well and you are not experiencing any problems, there is no need to return our call.  We will assume that you have returned to your regular daily activities without incident.  If any biopsies were taken you will be contacted by phone or by letter within the next 1-3 weeks.  Please call us at [phone] if you have not heard about the biopsies in 3 weeks.    SIGNATURES/CONFIDENTIALITY: You and/or your care partner have signed paperwork which will be entered into your electronic medical record.  These signatures attest to the fact that that the information above on your After Visit Summary has been reviewed and is understood.  Full responsibility of the confidentiality of this discharge information lies with you and/or your care-partner.

## 2017-11-07 NOTE — Op Note (Signed)
Alejandra Greene Patient Name: Alejandra Greene Procedure Date: 11/07/2017 11:26 AM MRN: [id number] Endoscopist: Jackquline Denmark , MD Age: 72 Referring MD:  Date of Birth: [birth year] Gender: Female Account #: [id number] Procedure:                Colonoscopy Indications:              High risk colon cancer surveillance: Personal                            history of colonic polyps Medicines:                Monitored Anesthesia Care Procedure:                Pre-Anesthesia Assessment:                           - Prior to the procedure, a History and Physical                            was performed, and patient medications and                            allergies were reviewed. The patient is competent.                            The risks and benefits of the procedure and the                            sedation options and risks were discussed with the                            patient. All questions were answered and informed                            consent was obtained. Patient identification and                            proposed procedure were verified in the procedure                            room. Mental Status Examination: alert and                            oriented. Prophylactic Antibiotics: The patient                            does not require prophylactic antibiotics. Prior                            Anticoagulants: The patient has taken no previous                            anticoagulant or antiplatelet agents. ASA Grade  Assessment: II - A patient with mild systemic                            disease. After reviewing the risks and benefits,                            the patient was deemed in satisfactory condition to                            undergo the procedure. The anesthesia plan was to                            use monitored anesthesia care (MAC). Immediately                            prior to administration of medications, the  patient                            was re-assessed for adequacy to receive sedatives.                            The heart rate, respiratory rate, oxygen                            saturations, blood pressure, adequacy of pulmonary                            ventilation, and response to care were monitored                            throughout the procedure. The physical status of                            the patient was re-assessed after the procedure.                           After obtaining informed consent, the colonoscope                            was passed under direct vision. Throughout the                            procedure, the patient's blood pressure, pulse, and                            oxygen saturations were monitored continuously. The                            Model PCF-H190DL [id number]) scope was introduced                            through the anus and advanced to the 2 cm into the  ileum. The colonoscopy was performed without                            difficulty. The patient tolerated the procedure                            well. The quality of the bowel preparation was                            good. Some retained stool in the right side of the                            colon. Aggressive suctioning and aspiration was                            performed. Scope In: 11:31:41 AM Scope Out: 44:31:54 AM Scope Withdrawal Time: 0 hours 10 minutes 50 seconds  Total Procedure Duration: 0 hours 15 minutes 18 seconds  Findings:                 A 4 mm polyp was found in the rectum. The polyp was                            sessile. The polyp was removed with a cold biopsy                            forceps. Resection and retrieval were complete.                            Estimated blood loss: none.                           A few small-mouthed diverticula were found in the                            sigmoid colon and ascending colon.                            Non-bleeding internal hemorrhoids were found. The                            hemorrhoids were small.                           The exam was otherwise without abnormality on                            direct and retroflexion views. Complications:            No immediate complications. Estimated Blood Loss:     Estimated blood loss: none. Impression:               - Diminutive colonic polyps status post polypectomy.                           - Mild colonic diverticulosis.                           -  Non-bleeding internal hemorrhoids.                           - The examination was otherwise normal on direct                            and retroflexion views. Recommendation:           - Patient has a contact number available for                            emergencies. The signs and symptoms of potential                            delayed complications were discussed with the                            patient. Return to normal activities tomorrow.                            Written discharge instructions were provided to the                            patient.                           - Resume previous diet.                           - Continue present medications.                           - Await pathology results.                           - Repeat colonoscopy for surveillance based on                            pathology results.                           - Return to GI clinic PRN. Jackquline Denmark, MD 11/07/2017 11:54:24 AM This report has been signed electronically.

## 2017-11-07 NOTE — Progress Notes (Signed)
Called to room to assist during endoscopic procedure.  Patient ID and intended procedure confirmed with present staff. Received instructions for my participation in the procedure from the performing physician.  

## 2017-11-08 ENCOUNTER — Telehealth: Payer: Self-pay | Admitting: *Deleted

## 2017-11-08 NOTE — Telephone Encounter (Signed)
  Follow up Call-  Call back number 11/07/2017  Post procedure Call Back phone  # [phone]  Permission to leave phone message Yes  Some recent data might be hidden     Patient questions:  Do you have a fever, pain , or abdominal swelling? No. Pain Score  0 *  Have you tolerated food without any problems? Yes.    Have you been able to return to your normal activities? Yes.    Do you have any questions about your discharge instructions: Diet   No. Medications  No. Follow up visit  No.  Do you have questions or concerns about your Care? No.  Actions: * If pain score is 4 or above: No action needed, pain <4.

## 2017-11-20 ENCOUNTER — Encounter: Payer: Self-pay | Admitting: Gastroenterology

## 2018-01-07 DIAGNOSIS — I10 Essential (primary) hypertension: Secondary | ICD-10-CM | POA: Diagnosis not present

## 2018-01-07 DIAGNOSIS — Z23 Encounter for immunization: Secondary | ICD-10-CM | POA: Diagnosis not present

## 2018-01-07 DIAGNOSIS — G4733 Obstructive sleep apnea (adult) (pediatric): Secondary | ICD-10-CM | POA: Diagnosis not present

## 2018-01-07 DIAGNOSIS — M797 Fibromyalgia: Secondary | ICD-10-CM | POA: Diagnosis not present

## 2018-01-07 DIAGNOSIS — R42 Dizziness and giddiness: Secondary | ICD-10-CM | POA: Diagnosis not present

## 2018-01-07 DIAGNOSIS — R7301 Impaired fasting glucose: Secondary | ICD-10-CM | POA: Diagnosis not present

## 2018-01-07 DIAGNOSIS — Z6832 Body mass index (BMI) 32.0-32.9, adult: Secondary | ICD-10-CM | POA: Diagnosis not present

## 2018-01-07 DIAGNOSIS — E782 Mixed hyperlipidemia: Secondary | ICD-10-CM | POA: Diagnosis not present

## 2018-03-12 ENCOUNTER — Other Ambulatory Visit: Payer: Self-pay | Admitting: *Deleted

## 2018-03-12 ENCOUNTER — Ambulatory Visit (INDEPENDENT_AMBULATORY_CARE_PROVIDER_SITE_OTHER): Payer: Medicare Other

## 2018-03-12 DIAGNOSIS — R42 Dizziness and giddiness: Secondary | ICD-10-CM | POA: Diagnosis not present

## 2018-03-12 DIAGNOSIS — R55 Syncope and collapse: Secondary | ICD-10-CM

## 2018-03-12 NOTE — Progress Notes (Signed)
[id number]  

## 2018-03-19 ENCOUNTER — Telehealth: Payer: Self-pay | Admitting: *Deleted

## 2018-03-19 DIAGNOSIS — R944 Abnormal results of kidney function studies: Secondary | ICD-10-CM | POA: Diagnosis not present

## 2018-03-19 DIAGNOSIS — R55 Syncope and collapse: Secondary | ICD-10-CM | POA: Diagnosis not present

## 2018-03-19 NOTE — Telephone Encounter (Signed)
Faxed over holter monitor results to Dr. Alyse Low office for office visit.

## 2018-04-18 ENCOUNTER — Ambulatory Visit (INDEPENDENT_AMBULATORY_CARE_PROVIDER_SITE_OTHER): Payer: Medicare Other | Admitting: Sports Medicine

## 2018-04-18 ENCOUNTER — Encounter: Payer: Self-pay | Admitting: Sports Medicine

## 2018-04-18 VITALS — BP 130/62 | HR 78 | Resp 16

## 2018-04-18 DIAGNOSIS — M79676 Pain in unspecified toe(s): Secondary | ICD-10-CM | POA: Diagnosis not present

## 2018-04-18 DIAGNOSIS — G588 Other specified mononeuropathies: Secondary | ICD-10-CM

## 2018-04-18 DIAGNOSIS — B351 Tinea unguium: Secondary | ICD-10-CM | POA: Diagnosis not present

## 2018-04-18 NOTE — Progress Notes (Signed)
Subjective: Alejandra Greene is a 73 y.o. female patient seen today in office with complaint of painful thickened and elongated toenails; unable to trim. Patient reports that she is on aspirin denies history of diabetes or any changes with medical conditions since last visit. Patient has no other pedal complaints at this time.   Patient Active Problem List   Diagnosis Date Noted  . 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 alendronate (FOSAMAX) 70 MG tablet Take 70 mg by mouth every Sunday.    Marland Kitchen aspirin EC 81 MG tablet Take 81 mg by mouth daily.    . bisacodyl (DULCOLAX) 5 MG EC tablet Take 5 mg by mouth once. X 4 for colon 8-1    . Cholecalciferol (VITAMIN D-3) 5000 units TABS Take 5,000 Units by mouth daily.    . cyclobenzaprine (FLEXERIL) 5 MG tablet Take 5-10 mg by mouth 3 (three) times daily as needed. For muscles spasms.    . ferrous sulfate 325 (65 FE) MG tablet Take 325 mg by mouth at bedtime.    Marland Kitchen lisinopril (PRINIVIL,ZESTRIL) 5 MG tablet Take 5 mg by mouth daily.    Marland Kitchen LORazepam (ATIVAN) 0.5 MG tablet TAKE 1 2 TO 1 (ONE HALF TO ONE) TABLET BY MOUTH ONCE DAILY AS NEEDED FOR ANXIETY  0  . Multiple Minerals-Vitamins (CAL-MAG ZINC II PO) Take 1 tablet by mouth 2 (two) times daily.    . Multiple Vitamin (MULTIVITAMIN WITH MINERALS) TABS tablet Take 2 tablets by mouth daily. Gummy Adult multivitamin Complete    . Omega-3 Fatty Acids (FISH OIL) 1200 MG CAPS Take 1,200 mg by mouth daily.    . polyethylene glycol powder (MIRALAX) powder Take 1 Container by mouth once. 238 grams for colon 8-1    . pravastatin (PRAVACHOL) 10 MG tablet Take 10 mg by mouth at bedtime.     . ranitidine (ZANTAC) 75 MG tablet Take 75 mg by mouth daily.    . vitamin B-12 (CYANOCOBALAMIN) 1000 MCG tablet Take 1,000 mcg by mouth daily.    Marland Kitchen omeprazole (PRILOSEC) 20 MG capsule Take 1 capsule (20  mg total) by mouth daily. 14 capsule 0   Current Facility-Administered Medications on File Prior to Visit  Medication Dose Route Frequency Provider Last Rate Last Dose  . 0.9 %  sodium chloride infusion  500 mL Intravenous Once Jackquline Denmark, MD        Allergies  Allergen Reactions  . Cymbalta [Duloxetine Hcl] Swelling    Swelling in hands and feet  . Lyrica [Pregabalin] Swelling    Hands and feet swell  . Codeine Nausea And Vomiting  . Nitrofuran Derivatives Nausea Only    Objective: Physical Exam  General: Well developed, nourished, no acute distress, awake, alert and oriented x 3  Vascular: Dorsalis pedis artery 1/4 bilateral, Posterior tibial artery 1/4 bilateral, skin temperature warm to warm proximal to distal bilateral lower extremities, mild varicosities, pedal hair present bilateral.  Neurological: Gross sensation present via light touch bilateral. Subjective burning, tingling, numbness to feet.  Dermatological: Skin is warm, dry, and supple bilateral, Nails 1-10 are tender, long, thick, and discolored with mild subungal debris, no acute ingrowing, no webspace macerations present bilateral, no open lesions present bilateral, no callus/corns/hyperkeratotic tissue present bilateral. No signs of infection bilateral.  Musculoskeletal: Aymptomatic hammertoe boney deformities noted bilateral. Muscular strength within normal limits without pain on range of motion.  No pain with calf compression bilateral.  Assessment and Plan:  Problem List Items Addressed This Visit    None    Visit Diagnoses    Onychomycosis    -  Primary   Pain of toe, unspecified laterality       Other mononeuropathy          -Examined patient.  -Discussed treatment options for painful mycotic nails. -Mechanically debrided and reduced mycotic nails with sterile nail nipper and dremel nail file without incident. -Patient to return in 3 months for follow up evaluation or sooner if symptoms  worsen.  Landis Martins, DPM

## 2018-05-27 DIAGNOSIS — M25551 Pain in right hip: Secondary | ICD-10-CM | POA: Diagnosis not present

## 2018-05-27 DIAGNOSIS — M545 Low back pain: Secondary | ICD-10-CM | POA: Diagnosis not present

## 2018-05-27 DIAGNOSIS — M19011 Primary osteoarthritis, right shoulder: Secondary | ICD-10-CM | POA: Diagnosis not present

## 2018-06-20 DIAGNOSIS — I10 Essential (primary) hypertension: Secondary | ICD-10-CM | POA: Diagnosis not present

## 2018-06-20 DIAGNOSIS — Z6830 Body mass index (BMI) 30.0-30.9, adult: Secondary | ICD-10-CM | POA: Diagnosis not present

## 2018-06-20 DIAGNOSIS — M4726 Other spondylosis with radiculopathy, lumbar region: Secondary | ICD-10-CM | POA: Diagnosis not present

## 2018-06-25 ENCOUNTER — Other Ambulatory Visit: Payer: Self-pay | Admitting: Neurological Surgery

## 2018-06-25 DIAGNOSIS — M4726 Other spondylosis with radiculopathy, lumbar region: Secondary | ICD-10-CM

## 2018-07-22 ENCOUNTER — Ambulatory Visit: Payer: Medicare Other | Admitting: Sports Medicine

## 2018-08-13 DIAGNOSIS — E7801 Familial hypercholesterolemia: Secondary | ICD-10-CM | POA: Diagnosis not present

## 2018-08-13 DIAGNOSIS — M810 Age-related osteoporosis without current pathological fracture: Secondary | ICD-10-CM | POA: Diagnosis not present

## 2018-08-13 DIAGNOSIS — I10 Essential (primary) hypertension: Secondary | ICD-10-CM | POA: Diagnosis not present

## 2018-08-13 DIAGNOSIS — N3000 Acute cystitis without hematuria: Secondary | ICD-10-CM | POA: Diagnosis not present

## 2018-08-26 ENCOUNTER — Ambulatory Visit: Payer: Medicare Other | Admitting: Sports Medicine

## 2018-09-08 IMAGING — DX DG SHOULDER 2+V PORT*R*
1 series · 1 of 1 positions shown · non-contrast
Comparison: None.

CLINICAL DATA: Postop.

EXAM:
PORTABLE RIGHT SHOULDER

[shoulder]
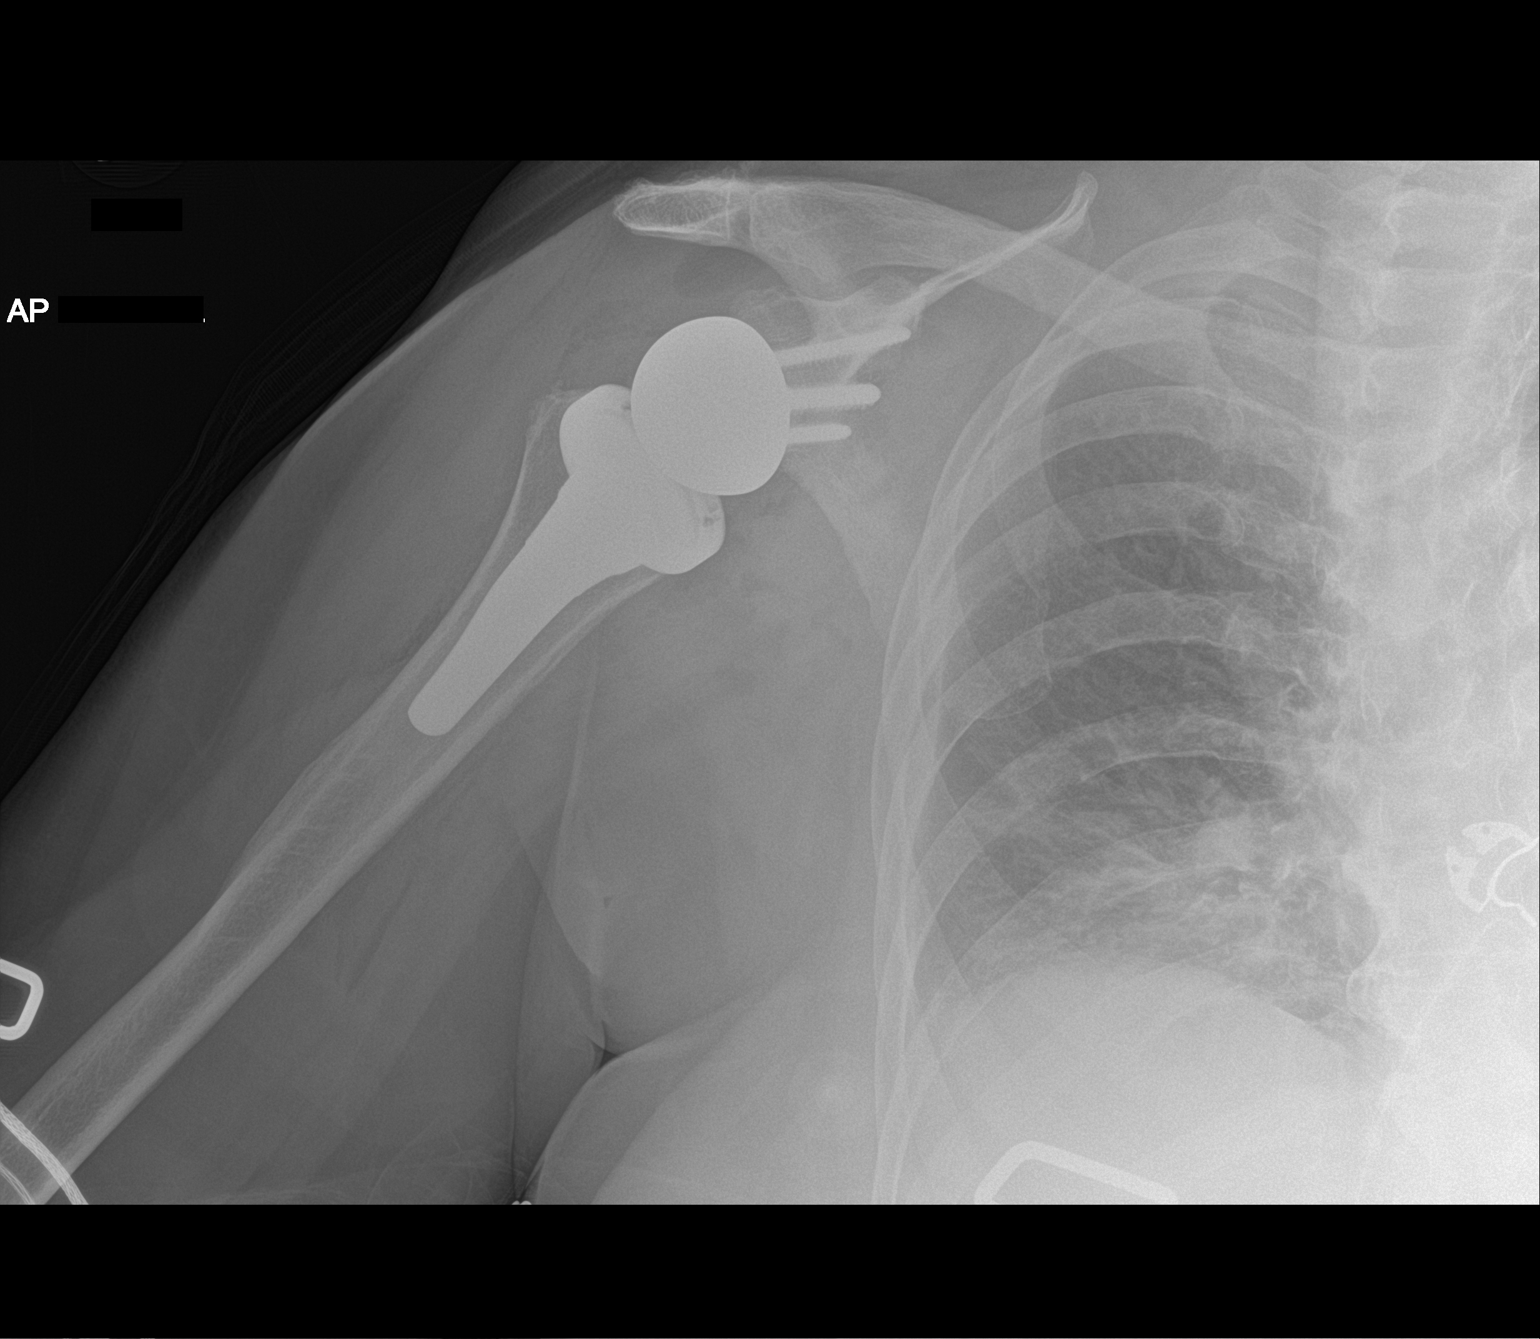

[1 of 1 positions shown; findings below may reference images not displayed]

FINDINGS: RIGHT shoulder replacement. Satisfactory proximal and distal
components which appear normally articulated.
IMPRESSION: No adverse features.

## 2018-09-09 ENCOUNTER — Other Ambulatory Visit: Payer: Self-pay

## 2018-09-09 ENCOUNTER — Ambulatory Visit
Admission: RE | Admit: 2018-09-09 | Discharge: 2018-09-09 | Disposition: A | Discharge status: Home / Self Care | Payer: Medicare Other | Source: Ambulatory Visit | Attending: Neurological Surgery | Admitting: Neurological Surgery

## 2018-09-09 DIAGNOSIS — M545 Low back pain: Secondary | ICD-10-CM | POA: Diagnosis not present

## 2018-09-09 DIAGNOSIS — M4726 Other spondylosis with radiculopathy, lumbar region: Secondary | ICD-10-CM

## 2018-09-10 DIAGNOSIS — M4726 Other spondylosis with radiculopathy, lumbar region: Secondary | ICD-10-CM | POA: Diagnosis not present

## 2018-09-15 DIAGNOSIS — I10 Essential (primary) hypertension: Secondary | ICD-10-CM | POA: Diagnosis not present

## 2018-09-15 DIAGNOSIS — M810 Age-related osteoporosis without current pathological fracture: Secondary | ICD-10-CM | POA: Diagnosis not present

## 2018-09-15 DIAGNOSIS — Z0000 Encounter for general adult medical examination without abnormal findings: Secondary | ICD-10-CM | POA: Diagnosis not present

## 2018-09-15 DIAGNOSIS — E7801 Familial hypercholesterolemia: Secondary | ICD-10-CM | POA: Diagnosis not present

## 2018-09-15 DIAGNOSIS — E559 Vitamin D deficiency, unspecified: Secondary | ICD-10-CM | POA: Diagnosis not present

## 2018-11-07 ENCOUNTER — Other Ambulatory Visit: Payer: Self-pay

## 2018-11-25 ENCOUNTER — Encounter: Payer: Self-pay | Admitting: Podiatry

## 2018-11-25 ENCOUNTER — Other Ambulatory Visit: Payer: Self-pay

## 2018-11-25 ENCOUNTER — Ambulatory Visit (INDEPENDENT_AMBULATORY_CARE_PROVIDER_SITE_OTHER): Payer: Medicare Other | Admitting: Podiatry

## 2018-11-25 VITALS — Temp 98.2°F

## 2018-11-25 DIAGNOSIS — B351 Tinea unguium: Secondary | ICD-10-CM

## 2018-11-25 NOTE — Progress Notes (Signed)
Complaint:  Visit Type: Patient returns to my office for continued preventative foot care services. Complaint: Patient states" my nails have grown long and thick and become painful to walk and wear shoes" . The patient presents for preventative foot care services. No changes to ROS  Podiatric Exam: Vascular: dorsalis pedis and posterior tibial pulses are palpable bilateral. Capillary return is immediate. Temperature gradient is WNL. Skin turgor WNL  Sensorium: Normal Semmes Weinstein monofilament test. Normal tactile sensation bilaterally. Nail Exam: Pt has thick disfigured discolored nails with subungual debris noted bilateral entire nail hallux through fifth toenails Ulcer Exam: There is no evidence of ulcer or pre-ulcerative changes or infection. Orthopedic Exam: Muscle tone and strength are WNL. No limitations in general ROM. No crepitus or effusions noted. Foot type and digits show no abnormalities. Tailors bunion fifth MPJ left foot.  Hammer toe second left foot. Skin: No Porokeratosis. No infection or ulcers  Diagnosis:  Onychomycosis, , Pain in right toe, pain in left toes  Treatment & Plan Procedures and Treatment: Consent by patient was obtained for treatment procedures.   Debridement of mycotic and hypertrophic toenails, 1 through 5 bilateral and clearing of subungual debris. No ulceration, no infection noted.  Return Visit-Office Procedure: Patient instructed to return to the office for a follow up visit  prn  for continued evaluation and treatment.    Gardiner Barefoot DPM

## 2018-12-04 DIAGNOSIS — G4733 Obstructive sleep apnea (adult) (pediatric): Secondary | ICD-10-CM | POA: Diagnosis not present

## 2018-12-04 DIAGNOSIS — Z9989 Dependence on other enabling machines and devices: Secondary | ICD-10-CM | POA: Diagnosis not present

## 2018-12-04 DIAGNOSIS — I10 Essential (primary) hypertension: Secondary | ICD-10-CM | POA: Diagnosis not present

## 2018-12-04 DIAGNOSIS — Z23 Encounter for immunization: Secondary | ICD-10-CM | POA: Diagnosis not present

## 2019-03-17 ENCOUNTER — Ambulatory Visit: Payer: Medicare Other | Admitting: Podiatry

## 2019-04-27 DIAGNOSIS — G4733 Obstructive sleep apnea (adult) (pediatric): Secondary | ICD-10-CM | POA: Diagnosis not present

## 2019-06-11 DIAGNOSIS — H2513 Age-related nuclear cataract, bilateral: Secondary | ICD-10-CM | POA: Diagnosis not present

## 2019-06-23 DIAGNOSIS — H2513 Age-related nuclear cataract, bilateral: Secondary | ICD-10-CM | POA: Diagnosis not present

## 2019-06-23 DIAGNOSIS — H25043 Posterior subcapsular polar age-related cataract, bilateral: Secondary | ICD-10-CM | POA: Diagnosis not present

## 2019-06-23 DIAGNOSIS — H18413 Arcus senilis, bilateral: Secondary | ICD-10-CM | POA: Diagnosis not present

## 2019-06-23 DIAGNOSIS — H25013 Cortical age-related cataract, bilateral: Secondary | ICD-10-CM | POA: Diagnosis not present

## 2019-06-23 DIAGNOSIS — H2512 Age-related nuclear cataract, left eye: Secondary | ICD-10-CM | POA: Diagnosis not present

## 2019-07-06 DIAGNOSIS — H2513 Age-related nuclear cataract, bilateral: Secondary | ICD-10-CM | POA: Diagnosis not present

## 2019-07-06 DIAGNOSIS — H2512 Age-related nuclear cataract, left eye: Secondary | ICD-10-CM | POA: Diagnosis not present

## 2019-07-07 DIAGNOSIS — H2511 Age-related nuclear cataract, right eye: Secondary | ICD-10-CM | POA: Diagnosis not present

## 2019-07-07 DIAGNOSIS — H25011 Cortical age-related cataract, right eye: Secondary | ICD-10-CM | POA: Diagnosis not present

## 2019-07-07 DIAGNOSIS — H25041 Posterior subcapsular polar age-related cataract, right eye: Secondary | ICD-10-CM | POA: Diagnosis not present

## 2019-07-27 DIAGNOSIS — H2513 Age-related nuclear cataract, bilateral: Secondary | ICD-10-CM | POA: Diagnosis not present

## 2019-07-27 DIAGNOSIS — H2511 Age-related nuclear cataract, right eye: Secondary | ICD-10-CM | POA: Diagnosis not present

## 2019-10-21 DIAGNOSIS — G4733 Obstructive sleep apnea (adult) (pediatric): Secondary | ICD-10-CM | POA: Diagnosis not present

## 2019-11-10 DIAGNOSIS — F418 Other specified anxiety disorders: Secondary | ICD-10-CM | POA: Diagnosis not present

## 2019-11-10 DIAGNOSIS — Z1152 Encounter for screening for COVID-19: Secondary | ICD-10-CM | POA: Diagnosis not present

## 2019-11-10 DIAGNOSIS — Z0000 Encounter for general adult medical examination without abnormal findings: Secondary | ICD-10-CM | POA: Diagnosis not present

## 2019-11-10 DIAGNOSIS — Z0184 Encounter for antibody response examination: Secondary | ICD-10-CM | POA: Diagnosis not present

## 2019-11-10 DIAGNOSIS — M810 Age-related osteoporosis without current pathological fracture: Secondary | ICD-10-CM | POA: Diagnosis not present

## 2019-11-10 DIAGNOSIS — I10 Essential (primary) hypertension: Secondary | ICD-10-CM | POA: Diagnosis not present

## 2019-11-10 DIAGNOSIS — E7801 Familial hypercholesterolemia: Secondary | ICD-10-CM | POA: Diagnosis not present

## 2019-11-20 DIAGNOSIS — G4733 Obstructive sleep apnea (adult) (pediatric): Secondary | ICD-10-CM | POA: Diagnosis not present

## 2019-12-16 DIAGNOSIS — Z780 Asymptomatic menopausal state: Secondary | ICD-10-CM | POA: Diagnosis not present

## 2019-12-16 DIAGNOSIS — Z1231 Encounter for screening mammogram for malignant neoplasm of breast: Secondary | ICD-10-CM | POA: Diagnosis not present

## 2019-12-16 DIAGNOSIS — L2389 Allergic contact dermatitis due to other agents: Secondary | ICD-10-CM | POA: Diagnosis not present

## 2019-12-16 DIAGNOSIS — R252 Cramp and spasm: Secondary | ICD-10-CM | POA: Diagnosis not present

## 2019-12-16 DIAGNOSIS — Z13820 Encounter for screening for osteoporosis: Secondary | ICD-10-CM | POA: Diagnosis not present

## 2020-01-08 DIAGNOSIS — M8589 Other specified disorders of bone density and structure, multiple sites: Secondary | ICD-10-CM | POA: Diagnosis not present

## 2020-01-08 DIAGNOSIS — M85851 Other specified disorders of bone density and structure, right thigh: Secondary | ICD-10-CM | POA: Diagnosis not present

## 2020-01-08 DIAGNOSIS — L2389 Allergic contact dermatitis due to other agents: Secondary | ICD-10-CM | POA: Diagnosis not present

## 2020-01-08 DIAGNOSIS — Z1231 Encounter for screening mammogram for malignant neoplasm of breast: Secondary | ICD-10-CM | POA: Diagnosis not present

## 2020-01-08 DIAGNOSIS — Z13820 Encounter for screening for osteoporosis: Secondary | ICD-10-CM | POA: Diagnosis not present

## 2020-01-08 DIAGNOSIS — M85832 Other specified disorders of bone density and structure, left forearm: Secondary | ICD-10-CM | POA: Diagnosis not present

## 2020-03-26 DIAGNOSIS — H66001 Acute suppurative otitis media without spontaneous rupture of ear drum, right ear: Secondary | ICD-10-CM | POA: Diagnosis not present

## 2020-05-01 IMAGING — MR MRI LUMBAR SPINE WITHOUT CONTRAST
4 of 5 series · 18 of 48 positions shown · non-contrast
Comparison: Radiographs dated 05/27/2018, CT myelogram of the
lumbar spine dated 05/24/2008 and lumbar MRI dated 09/28/2004

CLINICAL DATA: Low back pain with left hip and leg pain since
May 2018. Spondylosis with lumbar radiculopathy.

EXAM:
MRI LUMBAR SPINE WITHOUT CONTRAST
TECHNIQUE: Multiplanar, multisequence MR imaging of the lumbar spine was
performed. No intravenous contrast was administered.

[Series 6: T2 · sagittal · 4.0mm · 0.73mm/px · 6 of 15 slices shown (1 of 2)]
[im 1/15]
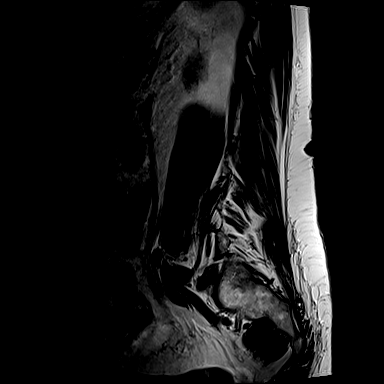
[im 3/15]
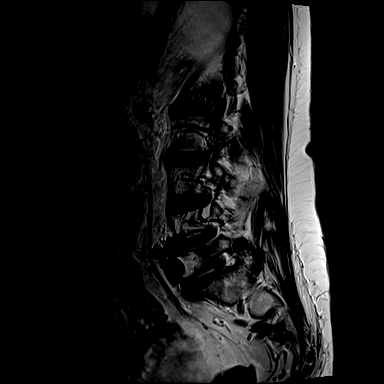
[im 6/15]
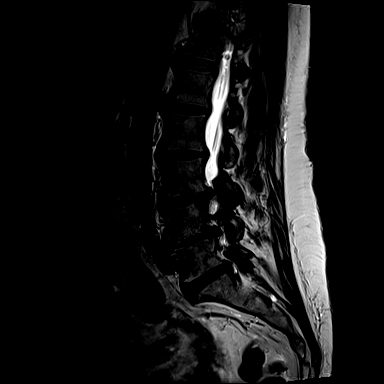
[im 9/15]
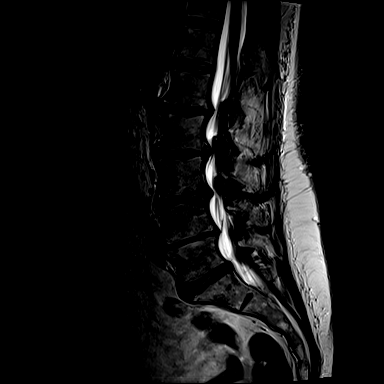
[im 12/15]
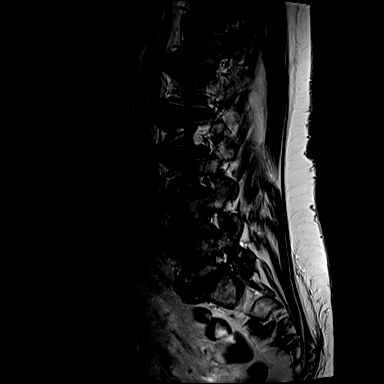
[im 15/15]
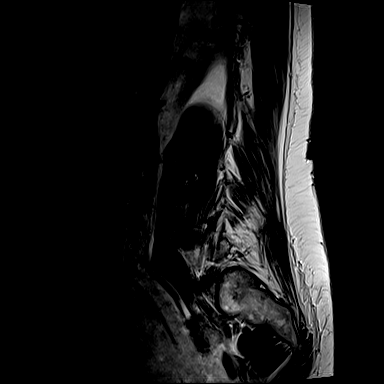

[Series 7: T1 · sagittal · 4.0mm · 0.73mm/px · 3 of 15 slices shown (1 of 2)]
[im 3/15]
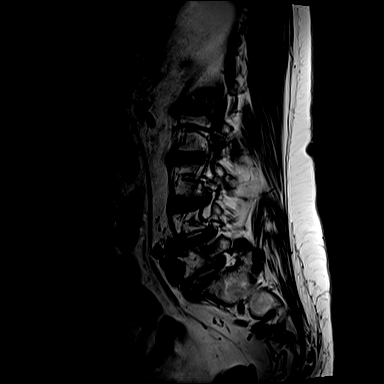
[im 9/15]
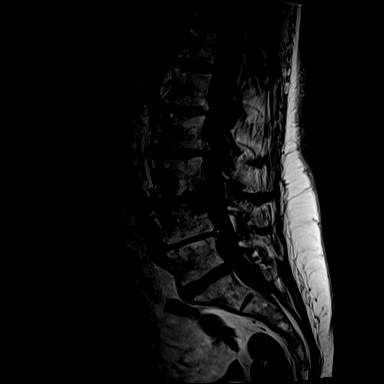
[im 15/15]
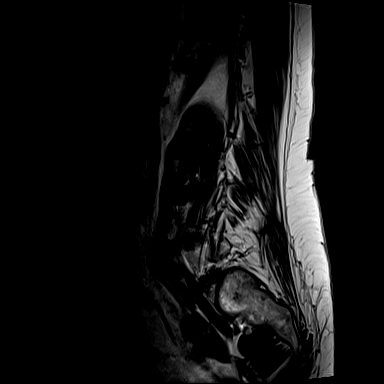

[Series 13: T2 · axial · 4.0mm · 0.28mm/px · z∈[-7,+175]mm · 6 of 39 slices shown (2 of 2)]
[im 1/39]
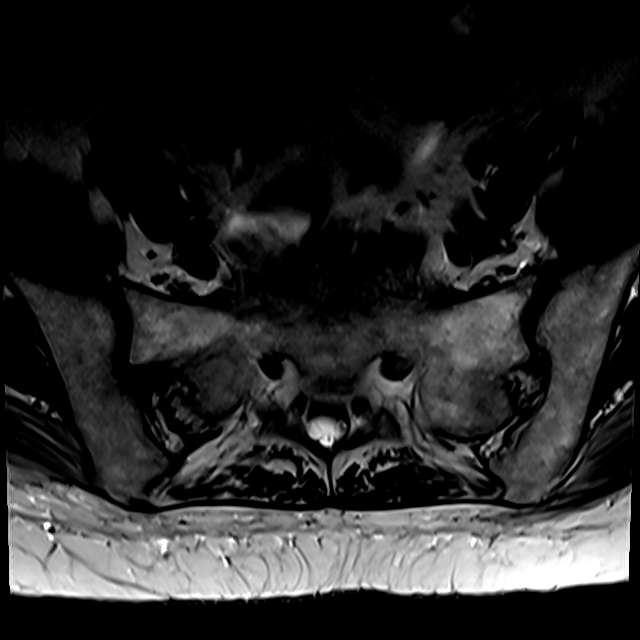
[im 6/39]
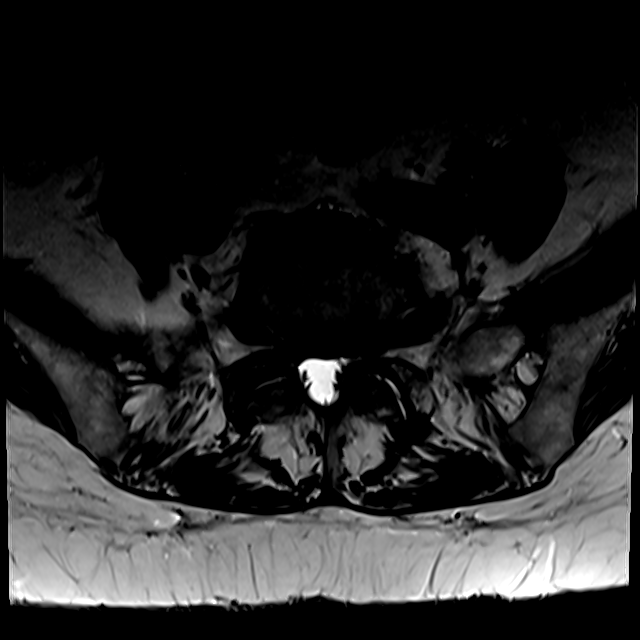
[im 11/39]
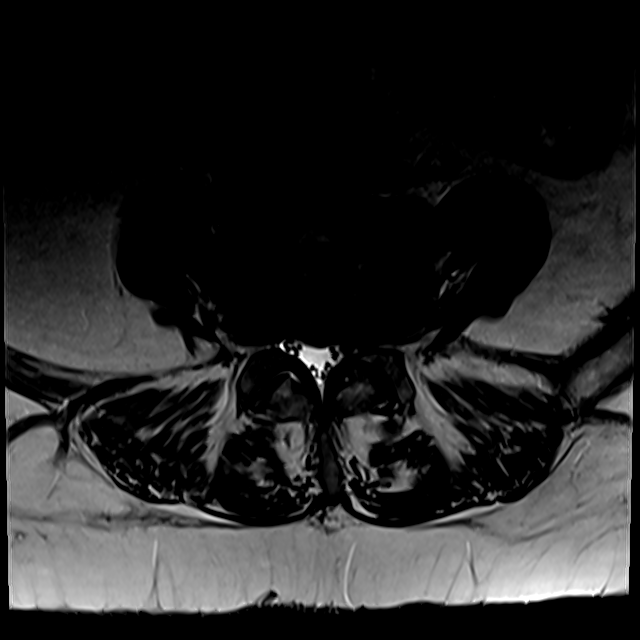
[im 17/39]
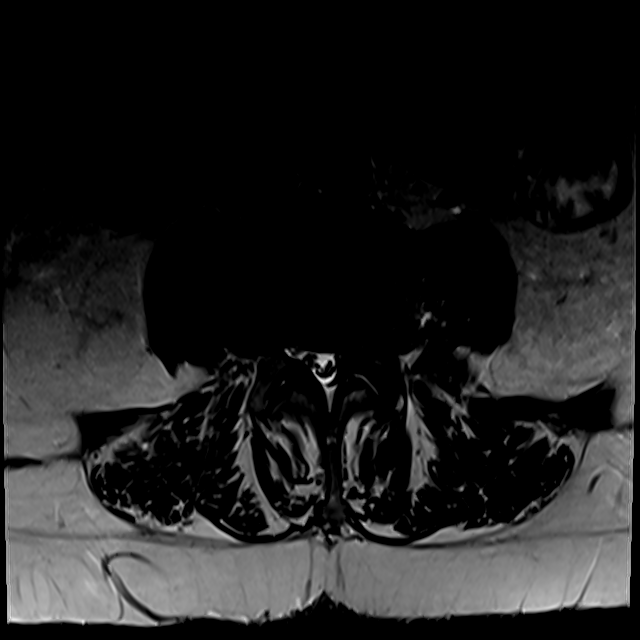
[im 20/39]
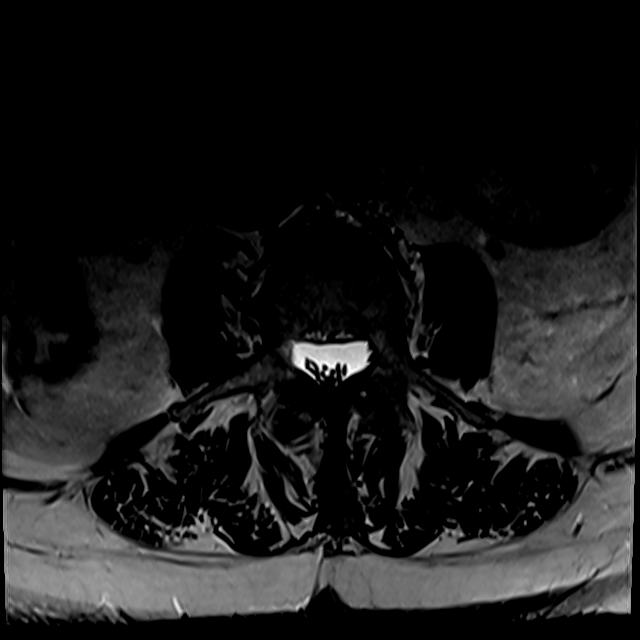
[im 33/39]
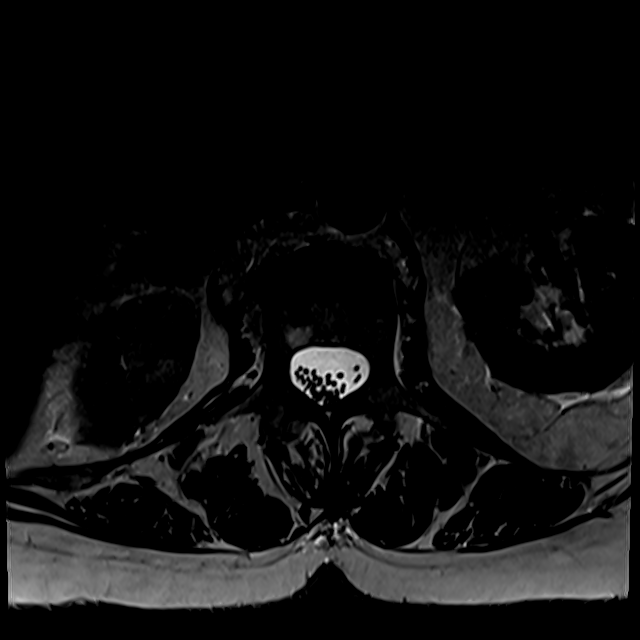

[Series 100: T1 · axial · 4.0mm · 0.28mm/px · z∈[+18,+175]mm · 3 of 39 slices shown (2 of 2)]
[im 6/39]
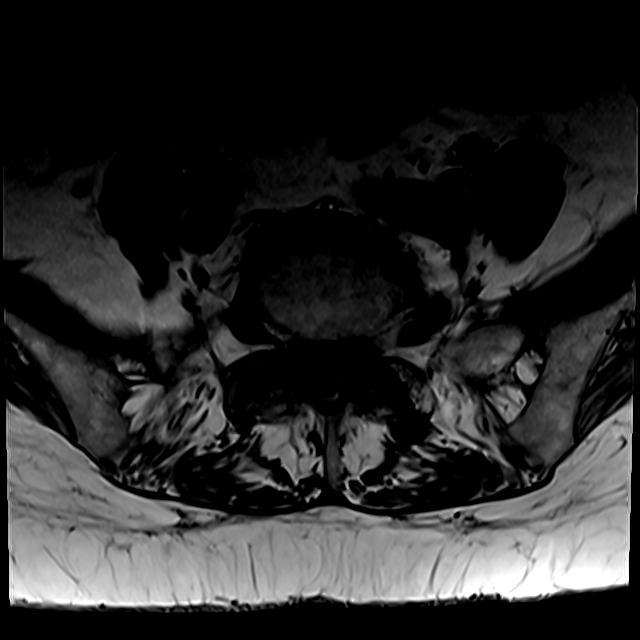
[im 20/39]
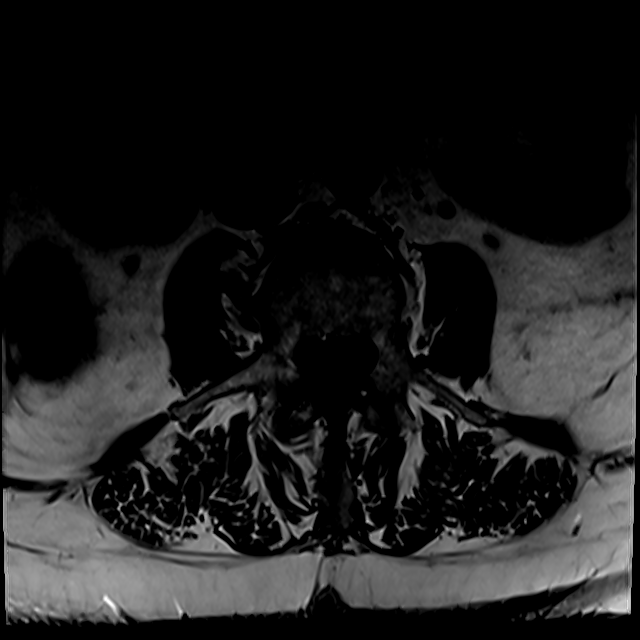
[im 33/39]
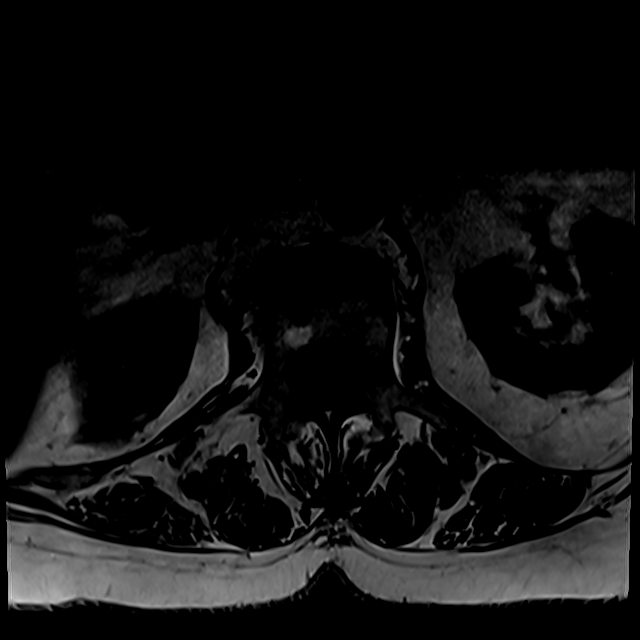

[18 of 48 positions shown; findings below may reference images not displayed]

FINDINGS: Segmentation: 6 non rib-bearing vertebra in the lumbar region. I
will term the lowest 5 vertebra as L1 through L5 as used on the
prior CT myelogram and lumbar MRI.

Alignment:  Physiologic.

Vertebrae: No fracture, evidence of discitis, or significant bone
lesion.

Conus medullaris and cauda equina: Conus extends to the L1 level.
Conus and cauda equina appear normal.

Paraspinal and other soft tissues: Negative.

Disc levels:

T12-L1: No significant abnormality.

L1-2: Disc space narrowing. Asymmetric disc bulge with accompanying
osteophytes the right of midline extending lateral to the neural
foramen without neural impingement. Minimal degenerative changes of
the right facet joint.

L2-3: Chronic small disc protrusion central and to the right
extending in and lateral to the neural foramen slightly compressing
right side of the thecal sac. This could affect the right L3 nerve
but appears essentially unchanged since the prior study. New slight
right facet arthritis.

L3-4: Chronic progressive disc space narrowing with progressive
degenerative changes of the vertebral endplates asymmetric to the
left. Small broad-based disc protrusion with accompanying
osteophytes essentially unchanged since the prior study. Compression
of the left lateral recess could affect the left L4 nerve and this
appears slightly more prominent than on the prior study. Slight
bilateral foraminal stenosis which is chronic. Mild chronic spinal
stenosis.

L4-5: Chronic progressive disc space narrowing. Small broad-based
endplate osteophytes slightly compress the thecal sac, actually less
prominent than on the prior study. Slight hypertrophy of the left
ligamentum flavum. Slight narrowing of the left lateral recess. This
is less prominent than on the prior study.

L5-S1: Tiny broad-based disc bulge without neural impingement.
Moderate left and mild right facet arthritis with ligamentum flavum
hypertrophy which narrows both lateral recesses could affect either
or both S1 nerves, right more likely the left. This has progressed
since the prior study.
IMPRESSION: 1. Progressive degenerative disc and joint disease at L5-S1 with
compression of both lateral recesses, right greater than left.
2. Left lateral recess compression at L3-4 with mild spinal
stenosis, unchanged. These findings could affect the left L4 nerve.
3. Right lateral recess compression at L2-3, progressed since the
prior study which could affect the right L3 nerve.

## 2023-05-15 ENCOUNTER — Other Ambulatory Visit: Payer: Self-pay

## 2023-05-15 ENCOUNTER — Emergency Department (HOSPITAL_COMMUNITY)
Admission: EM | Admit: 2023-05-15 | Discharge: 2023-05-15 | Disposition: A | Discharge status: Home / Self Care | Payer: Worker's Compensation | Attending: Emergency Medicine | Admitting: Emergency Medicine

## 2023-05-15 ENCOUNTER — Encounter (HOSPITAL_COMMUNITY): Payer: Self-pay

## 2023-05-15 DIAGNOSIS — Z7982 Long term (current) use of aspirin: Secondary | ICD-10-CM | POA: Insufficient documentation

## 2023-05-15 DIAGNOSIS — S81852A Open bite, left lower leg, initial encounter: Secondary | ICD-10-CM | POA: Insufficient documentation

## 2023-05-15 DIAGNOSIS — Z23 Encounter for immunization: Secondary | ICD-10-CM | POA: Insufficient documentation

## 2023-05-15 DIAGNOSIS — W540XXA Bitten by dog, initial encounter: Secondary | ICD-10-CM | POA: Diagnosis not present

## 2023-05-15 MED ORDER — AMOXICILLIN-POT CLAVULANATE 875-125 MG PO TABS
1.0000 | ORAL_TABLET | Freq: Once | ORAL | Status: AC
  Administered 2023-05-15: 1 via ORAL
  Filled 2023-05-15: qty 1

## 2023-05-15 MED ORDER — TETANUS-DIPHTH-ACELL PERTUSSIS 5-2.5-18.5 LF-MCG/0.5 IM SUSY
0.5000 mL | PREFILLED_SYRINGE | Freq: Once | INTRAMUSCULAR | Status: AC
  Administered 2023-05-15: 0.5 mL via INTRAMUSCULAR
  Filled 2023-05-15: qty 0.5

## 2023-05-15 MED ORDER — AMOXICILLIN-POT CLAVULANATE 875-125 MG PO TABS: 1.0000 | ORAL_TABLET | Freq: Two times a day (BID) | ORAL | 0 refills | Status: AC

## 2023-05-15 NOTE — ED Triage Notes (Signed)
 Pt works for [employer] in [place] and was bit by german shepard while on a call, says will be workman's comp claim, pt reports her chief reported to Emergency planning/management officer. Wound not bleeding.

## 2023-05-15 NOTE — ED Provider Notes (Signed)
 [place] EMERGENCY DEPARTMENT AT [facility] Provider Note   CSN: [id number] Arrival date & time: 05/15/23  1949     History  Chief Complaint  Patient presents with   Animal Bite    Alejandra Greene is a 78 y.o. 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  Patient states that she was bit on her left lower leg while on the call.  Patient denies pain or swelling.  Patient states that bleeding is controlled at this time.   Animal Bite      Home Medications Prior to Admission medications   Medication Sig Start Date End Date Taking? Authorizing Provider  amoxicillin -clavulanate (AUGMENTIN ) 875-125 MG tablet Take 1 tablet by mouth every 12 (twelve) hours. 05/15/23  Yes Nadelyn Enriques N, PA-C  aspirin EC 81 MG tablet Take by mouth.    [provider]  bisacodyl  (DULCOLAX) 5 MG EC tablet Take 5 mg by mouth once. X 4 for colon 8-1    [provider]  celecoxib  (CELEBREX ) 100 MG capsule Take by mouth. 09/13/18   [provider]  Cholecalciferol (VITAMIN D-3) 125 MCG (5000 UT) TABS Take by mouth.    [provider]  cyclobenzaprine (FLEXERIL) 5 MG tablet Take 5-10 mg by mouth 3 (three) times daily as needed. For muscles spasms. 12/20/16   [provider]  ferrous sulfate 325 (65 FE) MG tablet Take by mouth.    [provider]  lisinopril  (PRINIVIL ,ZESTRIL ) 5 MG tablet Take 5 mg by mouth daily. 12/27/16   [provider]  LORazepam (ATIVAN) 0.5 MG tablet Take by mouth.    [provider]  Multiple Minerals-Vitamins (CAL-MAG ZINC II PO) Take 1 tablet by mouth 2 (two) times daily.    [provider]  Multiple Vitamin (MULTIVITAMIN WITH MINERALS) TABS tablet Take 2 tablets by mouth daily. Gummy Adult multivitamin Complete    [provider]  Multiple Vitamins-Minerals (MULTIVITAMIN WITH MINERALS) tablet Take by mouth.     [provider]  Omega-3 Fatty Acids (FISH OIL) 1200 MG CPDR Take by mouth.    [provider]  omeprazole  (PRILOSEC) 20 MG capsule Take 1 capsule (20 mg total) by mouth daily. 01/17/17 01/31/17  Cristy Bonner DASEN, MD  polyethylene glycol (MIRALAX  / GLYCOLAX ) 17 g packet Take by mouth.    [provider]  pravastatin  (PRAVACHOL ) 10 MG tablet Take by mouth.    [provider]  ranitidine (ZANTAC) 75 MG tablet Take 75 mg by mouth daily.    [provider]  vitamin B-12 (CYANOCOBALAMIN) 1000 MCG tablet Take by mouth.    [provider]      Allergies    Codeine, Cymbalta [duloxetine hcl], Pregabalin, and Nitrofuran derivatives    Review of Systems   Review of Systems  Skin:  Positive for wound.    Physical Exam Updated Vital Signs BP (!) 183/86 (BP Location: Right Arm)   Pulse (!) 101   Temp 98.7 F (37.1 C) (Oral)   Resp 18   Ht 5' 6 (1.676 m)   Wt 87.5 kg   SpO2 96%   BMI 31.14 kg/m  Physical Exam Vitals and nursing note reviewed.  Constitutional:      General: She is not in acute distress.    Appearance: She is well-developed.  HENT:     Head: Normocephalic and atraumatic.     Right Ear: External ear normal.  Left Ear: External ear normal.     Nose: Nose normal.     Mouth/Throat:     Mouth: Mucous membranes are moist.     Pharynx: Oropharynx is clear.  Eyes:     Extraocular Movements: Extraocular movements intact.     Conjunctiva/sclera: Conjunctivae normal.  Cardiovascular:     Rate and Rhythm: Normal rate and regular rhythm.     Pulses: Normal pulses.  Pulmonary:     Effort: Pulmonary effort is normal.     Breath sounds: Normal breath sounds.  Abdominal:     Palpations: Abdomen is soft.  Musculoskeletal:        General: No swelling.     Cervical back: Neck supple.  Skin:    General: Skin is warm and dry.     Capillary Refill: Capillary refill takes less than 2 seconds.     Comments: Patient has  superficial abrasion like wounds to the distal left leg.  Patient has full range of motion, no ecchymosis, no swelling, and is neurovascularly intact.  Neurological:     Mental Status: She is alert.  Psychiatric:        Mood and Affect: Mood normal.     ED Results / Procedures / Treatments   Labs (all labs ordered are listed, but only abnormal results are displayed) Labs Reviewed - No data to display  EKG None  Radiology No results found.  Procedures Procedures    Medications Ordered in ED Medications  Tdap (BOOSTRIX ) injection 0.5 mL (has no administration in time range)  amoxicillin -clavulanate (AUGMENTIN ) 875-125 MG per tablet 1 tablet (has no administration in time range)    ED Course/ Medical Decision Making/ A&P                                 Medical Decision Making  This patient presents to the ED with chief complaint(s) of dog bite with pertinent past medical history of none which further complicates the presenting complaint. The complaint involves an extensive differential diagnosis and also carries with it a high risk of complications and morbidity.    The differential diagnosis includes open fracture, puncture wounds, lacerations, abrasion   ED Course and Reassessment: Wound irrigated with normal saline and covered with dressing Patient's Tdap updated Patient given first dose of Augmentin   Consultation: - Consulted or discussed management/test interpretation w/ external professional: None  Consideration for admission or further workup: Considered for admission or further workup however patient's vital signs and physical exam are reassuring.  Patient's wounds are very superficial and do not require any laceration repair.  Patient given 7 days of Augmentin  for infection prevention.  Patient's Tdap updated.  Patient should follow-up with primary care if symptoms persist for further evaluation and treatment.        Final Clinical Impression(s) / ED  Diagnoses Final diagnoses:  Dog bite, initial encounter    Rx / DC Orders ED Discharge Orders          Ordered    amoxicillin -clavulanate (AUGMENTIN ) 875-125 MG tablet  Every 12 hours        05/15/23 2221              Francis Ileana SAILOR, PA-C 05/15/23 2226    Suzette Pac, MD 05/20/23 1042

## 2023-05-15 NOTE — Discharge Instructions (Addendum)
 You are seen for a dog bite of your left lower leg.  Please pick up the antibiotic and take as prescribed.  You may take Tylenol  and Motrin as needed for pain.  Thank you for letting us  treat you today. After performing a physical exam, I feel you are safe to go home. Please follow up with your PCP in the next several days and provide them with your records from this visit. Return to the Emergency Room if pain becomes severe or symptoms worsen.

## 2023-07-22 ENCOUNTER — Other Ambulatory Visit: Payer: Self-pay

## 2023-07-22 ENCOUNTER — Ambulatory Visit: Attending: Orthopedic Surgery | Admitting: Physical Therapy

## 2023-07-22 ENCOUNTER — Encounter: Payer: Self-pay | Admitting: Physical Therapy

## 2023-07-22 DIAGNOSIS — M25562 Pain in left knee: Secondary | ICD-10-CM | POA: Diagnosis present

## 2023-07-22 DIAGNOSIS — M6281 Muscle weakness (generalized): Secondary | ICD-10-CM | POA: Diagnosis present

## 2023-07-22 DIAGNOSIS — G8929 Other chronic pain: Secondary | ICD-10-CM

## 2023-07-22 DIAGNOSIS — R600 Localized edema: Secondary | ICD-10-CM

## 2023-07-22 DIAGNOSIS — M25662 Stiffness of left knee, not elsewhere classified: Secondary | ICD-10-CM | POA: Diagnosis present

## 2023-07-22 NOTE — Therapy (Signed)
 OUTPATIENT PHYSICAL THERAPY LOWER EXTREMITY EVALUATION   Patient Name: Alejandra Greene MRN: [id number] DOB:[birth year], 78 y.o., female Today's Date: 07/22/2023  END OF SESSION:  PT End of Session - 07/22/23 1507     Visit Number 1    Number of Visits 12    Date for PT Re-Evaluation 09/02/23    PT Start Time 0237    PT Stop Time 0323    PT Time Calculation (min) 46 min    Activity Tolerance Patient tolerated treatment well    Behavior During Therapy WFL for tasks assessed/performed             Past Medical History:  Diagnosis Date   Arthritis    Cataract    beginnings    Complication of anesthesia    Fibromyalgia    GERD (gastroesophageal reflux disease)    Hypertension    pt states she has no high BP but on meds    IBS (irritable bowel syndrome)    Osteoporosis    PONV (postoperative nausea and vomiting)    Sleep apnea    CPAP at night   Past Surgical History:  Procedure Laterality Date   ABDOMINAL HYSTERECTOMY     CARPAL TUNNEL RELEASE Right    COLONOSCOPY  2017   KNEE ARTHROSCOPY Left    PLANTAR FASCIA SURGERY     REPLACEMENT TOTAL KNEE Right    REVERSE SHOULDER ARTHROPLASTY Right 01/16/2017   ROTATOR CUFF REPAIR Right    TOTAL SHOULDER ARTHROPLASTY Right 01/16/2017   Procedure: REVERSE TOTAL SHOULDER ARTHROPLASTY;  Surgeon: Bjorn Pippin, MD;  Location: [facility] OR;  Service: Orthopedics;  Laterality: Right;   Patient Active Problem List   Diagnosis Date Noted   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    SUBJECTIVE:   SUBJECTIVE STATEMENT: The patient presents to the clinic s/p left total knee replacement performed on 07/18/23.  She has been doing a home exercise which include using the Zero knee for extension.  She is walking safely with a FWW.  TED hose donned and Aquacel intact.  Her pain is a low 2/10 today.  Increased walking can increase her pain some.  Resting decreases her pain.    PERTINENT HISTORY: Right total knee replacement. PAIN:  Are you having pain? Yes: NPRS scale: 2/10. Pain location: Left knee Pain description: Sore, numb. Aggravating factors: See above. Relieving factors: See above.    PRECAUTIONS: Other: No ultrasound.    RED FLAGS: None   WEIGHT BEARING RESTRICTIONS: No  FALLS:  Has patient fallen in last 6 months? No  LIVING ENVIRONMENT: Lives in: House/apartment Stairs:  Ramp. Has following equipment at home: Dan Humphreys - 2 wheeled  OCCUPATION: Retired.  PLOF: Independent  PATIENT GOALS: Do ADL's without left knee pain.      OBJECTIVE:  Note: Objective measures were completed at Evaluation unless otherwise noted.  PATIENT SURVEYS:  LEFS 21/80  EDEMA:  Circumferential: Left 8 cms > right.  PALPATION: Diffuse anterior knee pain (mild).  LOWER EXTREMITY ROM:  Full left knee extension is supine and flexion to 90 degrees.  LOWER EXTREMITY MMT:  Patient able to perform an antigravity left LAQ and a SLR with min- assist.    GAIT: The patient is walking safely with normal cadence with a FWW.                                                                                                                                  TREATMENT DATE: 07/22/23:  Nustep level 1 x 5 minutes f/b LE elevation and vasopneumatic on low x 15 minutes to patient's left knee.      PATIENT EDUCATION:  Education details: Reviewed HEP. Person educated: Patient Education method: Software engineer Education comprehension: verbalized understanding and returned demonstration  HOME EXERCISE PROGRAM: As above.  ASSESSMENT:  CLINICAL IMPRESSION: The patient presents to [facility] s/p left total knee replacement performed on 07/18/23.  She is very pleased with her progress thus far and is compliant to her HEP.  She exhibits full extension and flexion to 90 degrees.  She is walking safely with a FWW with normal cadence.  She is above to perform a long arc quad and requires only min- assist to perform a SLR.  She currently exhibits a significant amount of edema. She has TED hose donned and her Aquacel is intact.  Her LEFS score is 21/80.   Patient will benefit from skilled physical therapy intervention to address pain and deficits.   OBJECTIVE IMPAIRMENTS: decreased activity tolerance, decreased ROM, decreased strength, increased edema, and pain.   ACTIVITY LIMITATIONS: lifting  PARTICIPATION LIMITATIONS: meal prep, cleaning, and laundry  REHAB POTENTIAL: Excellent  CLINICAL DECISION MAKING: Stable/uncomplicated  EVALUATION COMPLEXITY: Low   GOALS: LONG TERM GOALS: Target date: 09/02/23  Ind with a HEP.  Goal status: INITIAL  2.  Active knee flexion to 115 degrees+ so the patient can perform functional tasks and do so with pain not > 2-3/10.  Goal status: INITIAL  3.  Increase hip and knee strength to 5/5 to provide good stability for accomplishment of functional activities.  Goal status: INITIAL  4.  Decrease edema to within 3 cms of non-affected side to assist with pain reduction and range of motion gains.  Goal status: INITIAL  5.  Perform a reciprocating stair gait with one railing with pain not > 2-3/10.  Goal status: INITIAL  6.  Perform ADL's with pain not > 2-3/10.  Goal status: INITIAL   PLAN:  PT FREQUENCY: 2x/week  PT DURATION: 6 weeks  PLANNED INTERVENTIONS: 97110-Therapeutic exercises, 97530- Therapeutic activity, [id number]- Neuromuscular  re-education, 97535- Self Care, 16109- Manual therapy, G0283- Electrical stimulation (unattended), 97016- Vasopneumatic device, Patient/Family education, and Cryotherapy  PLAN FOR NEXT SESSION: Progress into total knee replacement protocol.  Vasopneumatic and elevation.   Vergia Chea, [place], PT 07/22/2023, 4:14 PM

## 2023-07-24 ENCOUNTER — Ambulatory Visit

## 2023-07-24 DIAGNOSIS — M25562 Pain in left knee: Secondary | ICD-10-CM | POA: Diagnosis not present

## 2023-07-24 DIAGNOSIS — R600 Localized edema: Secondary | ICD-10-CM

## 2023-07-24 DIAGNOSIS — M6281 Muscle weakness (generalized): Secondary | ICD-10-CM

## 2023-07-24 DIAGNOSIS — G8929 Other chronic pain: Secondary | ICD-10-CM

## 2023-07-24 DIAGNOSIS — M25662 Stiffness of left knee, not elsewhere classified: Secondary | ICD-10-CM

## 2023-07-24 NOTE — Therapy (Signed)
 OUTPATIENT PHYSICAL THERAPY LOWER EXTREMITY TREATMENT   Patient Name: Alejandra Greene MRN: [id number] DOB:04-09-1946, 78 y.o., female Today's Date: 07/24/2023  END OF SESSION:  PT End of Session - 07/24/23 1310     Visit Number 2    Number of Visits 12    Date for PT Re-Evaluation 09/02/23    PT Start Time 1300    PT Stop Time 1350    PT Time Calculation (min) 50 min    Activity Tolerance Patient tolerated treatment well    Behavior During Therapy WFL for tasks assessed/performed              Past Medical History:  Diagnosis Date   Arthritis    Cataract    beginnings    Complication of anesthesia    Fibromyalgia    GERD (gastroesophageal reflux disease)    Hypertension    pt states she has no high BP but on meds    IBS (irritable bowel syndrome)    Osteoporosis    PONV (postoperative nausea and vomiting)    Sleep apnea    CPAP at night   Past Surgical History:  Procedure Laterality Date   ABDOMINAL HYSTERECTOMY     CARPAL TUNNEL RELEASE Right    COLONOSCOPY  2017   KNEE ARTHROSCOPY Left    PLANTAR FASCIA SURGERY     REPLACEMENT TOTAL KNEE Right    REVERSE SHOULDER ARTHROPLASTY Right 01/16/2017   ROTATOR CUFF REPAIR Right    TOTAL SHOULDER ARTHROPLASTY Right 01/16/2017   Procedure: REVERSE TOTAL SHOULDER ARTHROPLASTY;  Surgeon: Bjorn Pippin, MD;  Location: [facility];  Service: Orthopedics;  Laterality: Right;   Patient Active Problem List   Diagnosis Date Noted   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    SUBJECTIVE:   SUBJECTIVE STATEMENT: Patient feels that she is getting better every  day. She is not hurting any today, but she is sore.   PERTINENT HISTORY: Right total knee replacement. PAIN:  Are you having pain? Yes: NPRS scale: 0/10. Pain location: Left knee Pain description: Sore, numb. Aggravating factors: See above. Relieving factors: See above.    PRECAUTIONS: Other: No ultrasound.    RED FLAGS: None   WEIGHT BEARING RESTRICTIONS: No  FALLS:  Has patient fallen in last 6 months? No  LIVING ENVIRONMENT: Lives in: House/apartment Stairs:  Ramp. Has following equipment at home: Dan Humphreys - 2 wheeled  OCCUPATION: Retired.  PLOF: Independent  PATIENT GOALS: Do ADL's without left knee pain.      OBJECTIVE:  Note: Objective measures were completed at Evaluation unless otherwise noted.  PATIENT SURVEYS:  LEFS 21/80  EDEMA:  Circumferential: Left 8 cms > right.  PALPATION: Diffuse anterior knee pain (mild).  LOWER EXTREMITY ROM:  Full left knee extension is supine and flexion to 90 degrees.  LOWER EXTREMITY MMT:  Patient able to perform an antigravity left LAQ and a SLR with min- assist.    GAIT: The patient is walking safely with normal cadence with a FWW.  TREATMENT DATE:                                    07/24/23 EXERCISE LOG  Exercise Repetitions and Resistance Comments  Nustep  L4 x 16 minutes @ seat 6   Rocker board  4 minutes    Lunges onto step  14" step x 2.5 minutes  For L knee flexion  LAQ 20 reps  LLE only   Seated heel slide  30 reps     Blank cell = exercise not performed today  Modalities: no adverse reaction to today's modalities  Date:  Vaso: Knee, 34 degrees; low pressure, 15 mins, Pain and Edema  PATIENT EDUCATION:  Education details: Reviewed HEP. Person educated: Patient Education method: Medical illustrator Education comprehension: verbalized understanding and  returned demonstration  HOME EXERCISE PROGRAM: As above.  ASSESSMENT:  CLINICAL IMPRESSION: Patient was introduced to multiple new interventions for improved left knee mobility. She required minimal cueing with today's new interventions for proper exercise performance to facilitate left knee mobility. She experienced no significant increase in pain or discomfort with any of today's interventions. She reported that her knee felt good upon the conclusion of treatment. She continues to require skilled physical therapy to address her remaining impairments to return to her prior level of function.   OBJECTIVE IMPAIRMENTS: decreased activity tolerance, decreased ROM, decreased strength, increased edema, and pain.   ACTIVITY LIMITATIONS: lifting  PARTICIPATION LIMITATIONS: meal prep, cleaning, and laundry  REHAB POTENTIAL: Excellent  CLINICAL DECISION MAKING: Stable/uncomplicated  EVALUATION COMPLEXITY: Low   GOALS: LONG TERM GOALS: Target date: 09/02/23  Ind with a HEP.  Goal status: INITIAL  2.  Active knee flexion to 115 degrees+ so the patient can perform functional tasks and do so with pain not > 2-3/10.  Goal status: INITIAL  3.  Increase hip and knee strength to 5/5 to provide good stability for accomplishment of functional activities.  Goal status: INITIAL  4.  Decrease edema to within 3 cms of non-affected side to assist with pain reduction and range of motion gains.  Goal status: INITIAL  5.  Perform a reciprocating stair gait with one railing with pain not > 2-3/10.  Goal status: INITIAL  6.  Perform ADL's with pain not > 2-3/10.  Goal status: INITIAL   PLAN:  PT FREQUENCY: 2x/week  PT DURATION: 6 weeks  PLANNED INTERVENTIONS: 97110-Therapeutic exercises, 97530- Therapeutic activity, [id number]- Neuromuscular re-education, 97535- Self Care, 09811- Manual therapy, G0283- Electrical stimulation (unattended), 97016- Vasopneumatic device, Patient/Family education, and  Cryotherapy  PLAN FOR NEXT SESSION: Progress into total knee replacement protocol.  Vasopneumatic and elevation.   Lane Pinon, PT 07/24/2023, 1:51 PM

## 2023-07-30 ENCOUNTER — Ambulatory Visit: Admitting: Physical Therapy

## 2023-07-30 DIAGNOSIS — M6281 Muscle weakness (generalized): Secondary | ICD-10-CM

## 2023-07-30 DIAGNOSIS — R600 Localized edema: Secondary | ICD-10-CM

## 2023-07-30 DIAGNOSIS — G8929 Other chronic pain: Secondary | ICD-10-CM

## 2023-07-30 DIAGNOSIS — M25562 Pain in left knee: Secondary | ICD-10-CM | POA: Diagnosis not present

## 2023-07-30 DIAGNOSIS — M25662 Stiffness of left knee, not elsewhere classified: Secondary | ICD-10-CM

## 2023-07-30 NOTE — Therapy (Signed)
 OUTPATIENT PHYSICAL THERAPY LOWER EXTREMITY TREATMENT   Patient Name: Alejandra Greene MRN: [id number] DOB:1945/11/04, 78 y.o., female Today's Date: 07/30/2023  END OF SESSION:  PT End of Session - 07/30/23 1504     Visit Number 3    Number of Visits 12    Date for PT Re-Evaluation 09/02/23    PT Start Time 0232    PT Stop Time 0317    PT Time Calculation (min) 45 min    Activity Tolerance Patient tolerated treatment well    Behavior During Therapy WFL for tasks assessed/performed               Past Medical History:  Diagnosis Date   Arthritis    Cataract    beginnings    Complication of anesthesia    Fibromyalgia    GERD (gastroesophageal reflux disease)    Hypertension    pt states she has no high BP but on meds    IBS (irritable bowel syndrome)    Osteoporosis    PONV (postoperative nausea and vomiting)    Sleep apnea    CPAP at night   Past Surgical History:  Procedure Laterality Date   ABDOMINAL HYSTERECTOMY     CARPAL TUNNEL RELEASE Right    COLONOSCOPY  2017   KNEE ARTHROSCOPY Left    PLANTAR FASCIA SURGERY     REPLACEMENT TOTAL KNEE Right    REVERSE SHOULDER ARTHROPLASTY Right 01/16/2017   ROTATOR CUFF REPAIR Right    TOTAL SHOULDER ARTHROPLASTY Right 01/16/2017   Procedure: REVERSE TOTAL SHOULDER ARTHROPLASTY;  Surgeon: Micheline Ahr, MD;  Location: [facility] OR;  Service: Orthopedics;  Laterality: Right;   Patient Active Problem List   Diagnosis Date Noted   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    SUBJECTIVE:   SUBJECTIVE STATEMENT: Back to MD and he was very pleased.  PERTINENT  HISTORY: Right total knee replacement. PAIN:  Are you having pain? Yes: NPRS scale: 0/10. Pain location: Left knee Pain description: Sore, numb. Aggravating factors: See above. Relieving factors: See above.    PRECAUTIONS: Other: No ultrasound.    RED FLAGS: None   WEIGHT BEARING RESTRICTIONS: No  FALLS:  Has patient fallen in last 6 months? No  LIVING ENVIRONMENT: Lives in: House/apartment Stairs:  Ramp. Has following equipment at home: [name] - 2 wheeled  OCCUPATION: Retired.  PLOF: Independent  PATIENT GOALS: Do ADL's without left knee pain.      OBJECTIVE:  Note: Objective measures were completed at Evaluation unless otherwise noted.  PATIENT SURVEYS:  LEFS 21/80  EDEMA:  Circumferential: Left 8 cms > right.  PALPATION: Diffuse anterior knee pain (mild).  LOWER EXTREMITY ROM:  Full left knee extension is supine and flexion to 90 degrees.  LOWER EXTREMITY MMT:  Patient able to perform an antigravity left LAQ and a SLR with min- assist.    GAIT: The patient is walking safely with normal cadence with a FWW.  TREATMENT DATE:   07/30/23:                                     EXERCISE LOG  Exercise Repetitions and Resistance Comments  Nustep  Level 3 x 10 minutes   Recumbent bike "Half-moons" on seat 4 x 5 minutes   LAQ            In supine:  Gentle low load long duration stretch into left knee flexion x 3 minutes f/b vasopneumatic on low with LE elevation.                                    07/24/23 EXERCISE LOG  Exercise Repetitions and Resistance Comments  Nustep  L4 x 16 minutes @ seat 6   Rocker board  4 minutes    Lunges onto step  14" step x 2.5 minutes  For L knee flexion  LAQ 20 reps  LLE only   Seated heel slide  30 reps     Blank cell = exercise not performed today  Modalities: no adverse reaction to  today's modalities  Date:  Vaso: Knee, 34 degrees; low pressure, 15 mins, Pain and Edema  PATIENT EDUCATION:  Education details: Reviewed HEP. Person educated: Patient Education method: Medical illustrator Education comprehension: verbalized understanding and returned demonstration  HOME EXERCISE PROGRAM: As above.  ASSESSMENT:  CLINICAL IMPRESSION: Patient is highly motivated and is making excellent progress.  She had an MD visit yesterday and he was very pleased.  She progress to making "half-moons" on the recumbent bike.  Increased LAQ's to 2# and added SAQ's which patient performed without difficulty.  OBJECTIVE IMPAIRMENTS: decreased activity tolerance, decreased ROM, decreased strength, increased edema, and pain.   ACTIVITY LIMITATIONS: lifting  PARTICIPATION LIMITATIONS: meal prep, cleaning, and laundry  REHAB POTENTIAL: Excellent  CLINICAL DECISION MAKING: Stable/uncomplicated  EVALUATION COMPLEXITY: Low   GOALS: LONG TERM GOALS: Target date: 09/02/23  Ind with a HEP.  Goal status: INITIAL  2.  Active knee flexion to 115 degrees+ so the patient can perform functional tasks and do so with pain not > 2-3/10.  Goal status: INITIAL  3.  Increase hip and knee strength to 5/5 to provide good stability for accomplishment of functional activities.  Goal status: INITIAL  4.  Decrease edema to within 3 cms of non-affected side to assist with pain reduction and range of motion gains.  Goal status: INITIAL  5.  Perform a reciprocating stair gait with one railing with pain not > 2-3/10.  Goal status: INITIAL  6.  Perform ADL's with pain not > 2-3/10.  Goal status: INITIAL   PLAN:  PT FREQUENCY: 2x/week  PT DURATION: 6 weeks  PLANNED INTERVENTIONS: 97110-Therapeutic exercises, 97530- Therapeutic activity, [id number]- Neuromuscular re-education, 97535- Self Care, 45409- Manual therapy, G0283- Electrical stimulation (unattended), 97016- Vasopneumatic device,  Patient/Family education, and Cryotherapy  PLAN FOR NEXT SESSION: Progress into total knee replacement protocol.  Vasopneumatic and elevation.   Armina Galloway, [place], PT 07/30/2023, 3:17 PM

## 2023-08-01 ENCOUNTER — Ambulatory Visit: Admitting: Physical Therapy

## 2023-08-01 DIAGNOSIS — R600 Localized edema: Secondary | ICD-10-CM

## 2023-08-01 DIAGNOSIS — M6281 Muscle weakness (generalized): Secondary | ICD-10-CM

## 2023-08-01 DIAGNOSIS — G8929 Other chronic pain: Secondary | ICD-10-CM

## 2023-08-01 DIAGNOSIS — M25662 Stiffness of left knee, not elsewhere classified: Secondary | ICD-10-CM

## 2023-08-01 DIAGNOSIS — M25562 Pain in left knee: Secondary | ICD-10-CM | POA: Diagnosis not present

## 2023-08-01 NOTE — Therapy (Signed)
 OUTPATIENT PHYSICAL THERAPY LOWER EXTREMITY TREATMENT   Patient Name: Alejandra Greene MRN: [id number] DOB:November 04, 1945, 78 y.o., female Today's Date: 08/01/2023  END OF SESSION:  PT End of Session - 08/01/23 1338     Visit Number 4    Number of Visits 12    Date for PT Re-Evaluation 09/02/23    PT Start Time 0100    PT Stop Time 0149    PT Time Calculation (min) 49 min    Activity Tolerance Patient tolerated treatment well    Behavior During Therapy WFL for tasks assessed/performed               Past Medical History:  Diagnosis Date   Arthritis    Cataract    beginnings    Complication of anesthesia    Fibromyalgia    GERD (gastroesophageal reflux disease)    Hypertension    pt states she has no high BP but on meds    IBS (irritable bowel syndrome)    Osteoporosis    PONV (postoperative nausea and vomiting)    Sleep apnea    CPAP at night   Past Surgical History:  Procedure Laterality Date   ABDOMINAL HYSTERECTOMY     CARPAL TUNNEL RELEASE Right    COLONOSCOPY  2017   KNEE ARTHROSCOPY Left    PLANTAR FASCIA SURGERY     REPLACEMENT TOTAL KNEE Right    REVERSE SHOULDER ARTHROPLASTY Right 01/16/2017   ROTATOR CUFF REPAIR Right    TOTAL SHOULDER ARTHROPLASTY Right 01/16/2017   Procedure: REVERSE TOTAL SHOULDER ARTHROPLASTY;  Surgeon: Micheline Ahr, MD;  Location: [facility];  Service: Orthopedics;  Laterality: Right;   Patient Active Problem List   Diagnosis Date Noted   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    SUBJECTIVE:   SUBJECTIVE STATEMENT: Stiff and sore.  PERTINENT HISTORY: Right  total knee replacement. PAIN:  Are you having pain? Yes: NPRS scale: 0/10. Pain location: Left knee Pain description: Sore, numb. Aggravating factors: See above. Relieving factors: See above.    PRECAUTIONS: Other: No ultrasound.    RED FLAGS: None   WEIGHT BEARING RESTRICTIONS: No  FALLS:  Has patient fallen in last 6 months? No  LIVING ENVIRONMENT: Lives in: House/apartment Stairs:  Ramp. Has following equipment at home: [name] - 2 wheeled  OCCUPATION: Retired.  PLOF: Independent  PATIENT GOALS: Do ADL's without left knee pain.      OBJECTIVE:  Note: Objective measures were completed at Evaluation unless otherwise noted.  PATIENT SURVEYS:  LEFS 21/80  EDEMA:  Circumferential: Left 8 cms > right.  PALPATION: Diffuse anterior knee pain (mild).  LOWER EXTREMITY ROM:  Full left knee extension is supine and flexion to 90 degrees.  LOWER EXTREMITY MMT:  Patient able to perform an antigravity left LAQ and a SLR with min- assist.    GAIT: The patient is walking safely with normal cadence with a FWW.  TREATMENT DATE:   08/01/23:                                     EXERCISE LOG  Exercise Repetitions and Resistance Comments  Nustep  Level 1 x 15 minutes    Rockerboard  With UE support x 5 minutes.   LAQ's with 2# 3 minutes.    f/b vasopneumatic on low with LE elevation x 20 minutes.    07/30/23:                                     EXERCISE LOG  Exercise Repetitions and Resistance Comments  Nustep  Level 3 x 10 minutes   Recumbent bike "Half-moons" on seat 4 x 5 minutes   LAQ            In supine:  Gentle low load long duration stretch into left knee flexion x 3 minutes f/b vasopneumatic on low with LE elevation.                                    07/24/23 EXERCISE LOG  Exercise Repetitions and Resistance Comments   Nustep  L4 x 16 minutes @ seat 6   Rocker board  4 minutes    Lunges onto step  14" step x 2.5 minutes  For L knee flexion  LAQ 20 reps  LLE only   Seated heel slide  30 reps     Blank cell = exercise not performed today  Modalities: no adverse reaction to today's modalities  Date:  Vaso: Knee, 34 degrees; low pressure, 15 mins, Pain and Edema  PATIENT EDUCATION:  Education details: Reviewed HEP. Person educated: Patient Education method: Medical illustrator Education comprehension: verbalized understanding and returned demonstration  HOME EXERCISE PROGRAM: As above.  ASSESSMENT:  CLINICAL IMPRESSION: Patient progressing very well.  Easier treatment today due to c/o increase stiffness and soreness.    OBJECTIVE IMPAIRMENTS: decreased activity tolerance, decreased ROM, decreased strength, increased edema, and pain.   ACTIVITY LIMITATIONS: lifting  PARTICIPATION LIMITATIONS: meal prep, cleaning, and laundry  REHAB POTENTIAL: Excellent  CLINICAL DECISION MAKING: Stable/uncomplicated  EVALUATION COMPLEXITY: Low   GOALS: LONG TERM GOALS: Target date: 09/02/23  Ind with a HEP.  Goal status: INITIAL  2.  Active knee flexion to 115 degrees+ so the patient can perform functional tasks and do so with pain not > 2-3/10.  Goal status: INITIAL  3.  Increase hip and knee strength to 5/5 to provide good stability for accomplishment of functional activities.  Goal status: INITIAL  4.  Decrease edema to within 3 cms of non-affected side to assist with pain reduction and range of motion gains.  Goal status: INITIAL  5.  Perform a reciprocating stair gait with one railing with pain not > 2-3/10.  Goal status: INITIAL  6.  Perform ADL's with pain not > 2-3/10.  Goal status: INITIAL   PLAN:  PT FREQUENCY: 2x/week  PT DURATION: 6 weeks  PLANNED INTERVENTIONS: 97110-Therapeutic exercises, 97530- Therapeutic activity, [id number]- Neuromuscular re-education, 97535- Self  Care, 78295- Manual therapy, G0283- Electrical stimulation (unattended), 97016- Vasopneumatic device, Patient/Family education, and Cryotherapy  PLAN FOR NEXT SESSION: Progress into total knee replacement protocol.  Vasopneumatic and elevation.   Wenceslaus Gist, PT  08/01/2023, 1:51 PM

## 2023-08-06 ENCOUNTER — Ambulatory Visit: Admitting: Physical Therapy

## 2023-08-06 DIAGNOSIS — M25562 Pain in left knee: Secondary | ICD-10-CM | POA: Diagnosis not present

## 2023-08-06 DIAGNOSIS — M6281 Muscle weakness (generalized): Secondary | ICD-10-CM

## 2023-08-06 DIAGNOSIS — M25662 Stiffness of left knee, not elsewhere classified: Secondary | ICD-10-CM

## 2023-08-06 DIAGNOSIS — G8929 Other chronic pain: Secondary | ICD-10-CM

## 2023-08-06 DIAGNOSIS — R600 Localized edema: Secondary | ICD-10-CM

## 2023-08-06 NOTE — Therapy (Signed)
 OUTPATIENT PHYSICAL THERAPY LOWER EXTREMITY TREATMENT   Patient Name: Alejandra Greene MRN: [id number] DOB:22-Dec-1945, 78 y.o., female Today's Date: 08/06/2023  END OF SESSION:  PT End of Session - 08/06/23 1453     Visit Number 5    Number of Visits 12    Date for PT Re-Evaluation 09/02/23    PT Start Time 0230    PT Stop Time 0327    PT Time Calculation (min) 57 min    Activity Tolerance Patient tolerated treatment well    Behavior During Therapy WFL for tasks assessed/performed               Past Medical History:  Diagnosis Date   Arthritis    Cataract    beginnings    Complication of anesthesia    Fibromyalgia    GERD (gastroesophageal reflux disease)    Hypertension    pt states she has no high BP but on meds    IBS (irritable bowel syndrome)    Osteoporosis    PONV (postoperative nausea and vomiting)    Sleep apnea    CPAP at night   Past Surgical History:  Procedure Laterality Date   ABDOMINAL HYSTERECTOMY     CARPAL TUNNEL RELEASE Right    COLONOSCOPY  2017   KNEE ARTHROSCOPY Left    PLANTAR FASCIA SURGERY     REPLACEMENT TOTAL KNEE Right    REVERSE SHOULDER ARTHROPLASTY Right 01/16/2017   ROTATOR CUFF REPAIR Right    TOTAL SHOULDER ARTHROPLASTY Right 01/16/2017   Procedure: REVERSE TOTAL SHOULDER ARTHROPLASTY;  Surgeon: Micheline Ahr, MD;  Location: [facility] OR;  Service: Orthopedics;  Laterality: Right;   Patient Active Problem List   Diagnosis Date Noted   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    SUBJECTIVE:   SUBJECTIVE STATEMENT: Did a lot yesterday PERTINENT HISTORY: Right  total knee replacement. PAIN:  Are you having pain? Yes: NPRS scale: 0/10. Pain location: Left knee Pain description: Sore, numb. Aggravating factors: See above. Relieving factors: See above.    PRECAUTIONS: Other: No ultrasound.    RED FLAGS: None   WEIGHT BEARING RESTRICTIONS: No  FALLS:  Has patient fallen in last 6 months? No  LIVING ENVIRONMENT: Lives in: House/apartment Stairs:  Ramp. Has following equipment at home: [name] - 2 wheeled  OCCUPATION: Retired.  PLOF: Independent  PATIENT GOALS: Do ADL's without left knee pain.      OBJECTIVE:  Note: Objective measures were completed at Evaluation unless otherwise noted.  PATIENT SURVEYS:  LEFS 21/80  EDEMA:  Circumferential: Left 8 cms > right.  PALPATION: Diffuse anterior knee pain (mild).  LOWER EXTREMITY ROM:  Full left knee extension is supine and flexion to 90 degrees.  LOWER EXTREMITY MMT:  Patient able to perform an antigravity left LAQ and a SLR with min- assist.    GAIT: The patient is walking safely with normal cadence with a FWW.  TREATMENT DATE:   08/06/23:                                     EXERCISE LOG  Exercise Repetitions and Resistance Comments  Nustep Level 3 x 15 minutes   Rockerboard In parallel bars x 3 minutes   Step-ups 6 inch x 2 minutes   Box lunges 14 inch x 4 minutes    SAQ's 4# x 4 minutes    f/b vasopneumatic on low with LE elevation x 20 minutes.    08/01/23:                                     EXERCISE LOG  Exercise Repetitions and Resistance Comments  Nustep  Level 1 x 15 minutes    Rockerboard  With UE support x 5 minutes.   LAQ's with 2# 3 minutes.    f/b vasopneumatic on low with LE elevation x 20 minutes.    07/30/23:                                     EXERCISE LOG  Exercise Repetitions and Resistance Comments   Nustep  Level 3 x 10 minutes   Recumbent bike "Half-moons" on seat 4 x 5 minutes   LAQ            In supine:  Gentle low load long duration stretch into left knee flexion x 3 minutes f/b vasopneumatic on low with LE elevation.                                    07/24/23 EXERCISE LOG  Exercise Repetitions and Resistance Comments  Nustep  L4 x 16 minutes @ seat 6   Rocker board  4 minutes    Lunges onto step  14" step x 2.5 minutes  For L knee flexion  LAQ 20 reps  LLE only   Seated heel slide  30 reps     Blank cell = exercise not performed today  Modalities: no adverse reaction to today's modalities  Date:  Vaso: Knee, 34 degrees; low pressure, 15 mins, Pain and Edema  PATIENT EDUCATION:  Education details: Reviewed HEP. Person educated: Patient Education method: Medical illustrator Education comprehension: verbalized understanding and returned demonstration  HOME EXERCISE PROGRAM: As above.  ASSESSMENT:  CLINICAL IMPRESSION: Patient progressing very well.  Added therapeutic interventions today which patient performed with excellent technique.     OBJECTIVE IMPAIRMENTS: decreased activity tolerance, decreased ROM, decreased strength, increased edema, and pain.   ACTIVITY LIMITATIONS: lifting  PARTICIPATION LIMITATIONS: meal prep, cleaning, and laundry  REHAB POTENTIAL: Excellent  CLINICAL DECISION MAKING: Stable/uncomplicated  EVALUATION COMPLEXITY: Low   GOALS: LONG TERM GOALS: Target date: 09/02/23  Ind with a HEP.  Goal status: MET  2.  Active knee flexion to 115 degrees+ so the patient can perform functional tasks and do so with pain not > 2-3/10.  Goal status: In progress.  3.  Increase hip and knee strength to 5/5 to provide good stability for accomplishment of functional activities.  Goal status: In progress.  4.  Decrease edema to within 3 cms of non-affected side to assist with pain  reduction and range of motion gains.  Goal status: In  progress.  5.  Perform a reciprocating stair gait with one railing with pain not > 2-3/10.  Goal status: In progress.  6.  Perform ADL's with pain not > 2-3/10.  Goal status: In progress.    PLAN:  PT FREQUENCY: 2x/week  PT DURATION: 6 weeks  PLANNED INTERVENTIONS: 97110-Therapeutic exercises, 97530- Therapeutic activity, [id number]- Neuromuscular re-education, 97535- Self Care, 16109- Manual therapy, G0283- Electrical stimulation (unattended), 97016- Vasopneumatic device, Patient/Family education, and Cryotherapy  PLAN FOR NEXT SESSION: Progress into total knee replacement protocol.  Vasopneumatic and elevation.   Judythe Postema, [place], PT 08/06/2023, 3:27 PM

## 2023-08-08 ENCOUNTER — Ambulatory Visit: Attending: Orthopedic Surgery | Admitting: Physical Therapy

## 2023-08-08 DIAGNOSIS — R600 Localized edema: Secondary | ICD-10-CM | POA: Diagnosis present

## 2023-08-08 DIAGNOSIS — M6281 Muscle weakness (generalized): Secondary | ICD-10-CM | POA: Insufficient documentation

## 2023-08-08 DIAGNOSIS — M25662 Stiffness of left knee, not elsewhere classified: Secondary | ICD-10-CM | POA: Insufficient documentation

## 2023-08-08 DIAGNOSIS — G8929 Other chronic pain: Secondary | ICD-10-CM | POA: Insufficient documentation

## 2023-08-08 DIAGNOSIS — M25562 Pain in left knee: Secondary | ICD-10-CM | POA: Diagnosis present

## 2023-08-08 NOTE — Therapy (Signed)
 OUTPATIENT PHYSICAL THERAPY LOWER EXTREMITY TREATMENT   Patient Name: Alejandra Greene MRN: [id number] DOB:03-Jul-1945, 78 y.o., female Today's Date: 08/08/2023  END OF SESSION:  PT End of Session - 08/08/23 1406     Visit Number 7    Number of Visits 12    Date for PT Re-Evaluation 09/02/23    PT Start Time 0145    PT Stop Time 0241    PT Time Calculation (min) 56 min    Activity Tolerance Patient tolerated treatment well    Behavior During Therapy WFL for tasks assessed/performed               Past Medical History:  Diagnosis Date   Arthritis    Cataract    beginnings    Complication of anesthesia    Fibromyalgia    GERD (gastroesophageal reflux disease)    Hypertension    pt states she has no high BP but on meds    IBS (irritable bowel syndrome)    Osteoporosis    PONV (postoperative nausea and vomiting)    Sleep apnea    CPAP at night   Past Surgical History:  Procedure Laterality Date   ABDOMINAL HYSTERECTOMY     CARPAL TUNNEL RELEASE Right    COLONOSCOPY  2017   KNEE ARTHROSCOPY Left    PLANTAR FASCIA SURGERY     REPLACEMENT TOTAL KNEE Right    REVERSE SHOULDER ARTHROPLASTY Right 01/16/2017   ROTATOR CUFF REPAIR Right    TOTAL SHOULDER ARTHROPLASTY Right 01/16/2017   Procedure: REVERSE TOTAL SHOULDER ARTHROPLASTY;  Surgeon: Micheline Ahr, MD;  Location: [facility] OR;  Service: Orthopedics;  Laterality: Right;   Patient Active Problem List   Diagnosis Date Noted   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    SUBJECTIVE:   SUBJECTIVE STATEMENT: Not hurting.  Sore.  PERTINENT HISTORY: Right  total knee replacement. PAIN:  Are you having pain? Yes: NPRS scale: 0/10. Pain location: Left knee Pain description: Sore, numb. Aggravating factors: See above. Relieving factors: See above.    PRECAUTIONS: Other: No ultrasound.    RED FLAGS: None   WEIGHT BEARING RESTRICTIONS: No  FALLS:  Has patient fallen in last 6 months? No  LIVING ENVIRONMENT: Lives in: House/apartment Stairs:  Ramp. Has following equipment at home: [name] - 2 wheeled  OCCUPATION: Retired.  PLOF: Independent  PATIENT GOALS: Do ADL's without left knee pain.      OBJECTIVE:  Note: Objective measures were completed at Evaluation unless otherwise noted.  PATIENT SURVEYS:  LEFS 21/80  EDEMA:  Circumferential: Left 8 cms > right.  PALPATION: Diffuse anterior knee pain (mild).  LOWER EXTREMITY ROM:  Full left knee extension is supine and flexion to 90 degrees.  08/08/23:  Active flexion 105 degrees and passive to 110 degrees.  LOWER EXTREMITY MMT:  Patient able to perform an antigravity left LAQ and a SLR with min- assist.    GAIT: The patient is walking safely with normal cadence with a FWW.  TREATMENT DATE:   08/08/23:                                     EXERCISE LOG  Exercise Repetitions and Resistance Comments  Nustep Level 3 x 16 minutes moving seat forward x 2 to increase flexion.   Rockerboard In parallel bars x 3 minutes   Box lunges 14 inch x 2 minutes   LAQ's 4# x 3 minutes.       In supine:  Low load long duration stretching x 3 minutes  f/b vasopneumatic on low with LE elevation x 20 minutes.    08/06/23:                                     EXERCISE LOG  Exercise Repetitions and Resistance Comments  Nustep Level 3 x 15 minutes   Rockerboard In parallel bars x 3 minutes   Step-ups 6 inch x 2 minutes   Box lunges 14 inch x 4 minutes     SAQ's 4# x 4 minutes    f/b vasopneumatic on low with LE elevation x 20 minutes.        PATIENT EDUCATION:  Education details: Reviewed HEP. Person educated: Patient Education method: Medical illustrator Education comprehension: verbalized understanding and returned demonstration  HOME EXERCISE PROGRAM: As above.  ASSESSMENT:  CLINICAL IMPRESSION: Patient making excellent progress and achieved active right knee flexion to 105 degrees and passive to 110 degrees.  OBJECTIVE IMPAIRMENTS: decreased activity tolerance, decreased ROM, decreased strength, increased edema, and pain.   ACTIVITY LIMITATIONS: lifting  PARTICIPATION LIMITATIONS: meal prep, cleaning, and laundry  REHAB POTENTIAL: Excellent  CLINICAL DECISION MAKING: Stable/uncomplicated  EVALUATION COMPLEXITY: Low   GOALS: LONG TERM GOALS: Target date: 09/02/23  Ind with a HEP.  Goal status: MET  2.  Active knee flexion to 115 degrees+ so the patient can perform functional tasks and do so with pain not > 2-3/10.  Goal status: 08/08/23:  Active flexion 105 degrees and passive to 110 degrees.  3.  Increase hip and knee strength to 5/5 to provide good stability for accomplishment of functional activities.  Goal status: In progress.  4.  Decrease edema to within 3 cms of non-affected side to assist with pain reduction and range of motion gains.  Goal status: In progress.  5.  Perform a reciprocating stair gait with one railing with pain not > 2-3/10.  Goal status: In progress.  6.  Perform ADL's with pain not > 2-3/10.  Goal status: In progress.    PLAN:  PT FREQUENCY: 2x/week  PT DURATION: 6 weeks  PLANNED INTERVENTIONS: 97110-Therapeutic exercises, 97530- Therapeutic activity, [id number]- Neuromuscular re-education, 97535- Self Care, 16109- Manual therapy, G0283- Electrical stimulation (unattended), 97016- Vasopneumatic device, Patient/Family education, and Cryotherapy  PLAN FOR NEXT SESSION:  Progress into total knee replacement protocol.  Vasopneumatic and elevation.   Neamiah Sciarra, [place], PT 08/08/2023, 2:52 PM

## 2023-08-13 ENCOUNTER — Ambulatory Visit: Admitting: Physical Therapy

## 2023-08-13 DIAGNOSIS — R600 Localized edema: Secondary | ICD-10-CM

## 2023-08-13 DIAGNOSIS — G8929 Other chronic pain: Secondary | ICD-10-CM

## 2023-08-13 DIAGNOSIS — M6281 Muscle weakness (generalized): Secondary | ICD-10-CM

## 2023-08-13 DIAGNOSIS — M25562 Pain in left knee: Secondary | ICD-10-CM | POA: Diagnosis not present

## 2023-08-13 DIAGNOSIS — M25662 Stiffness of left knee, not elsewhere classified: Secondary | ICD-10-CM

## 2023-08-13 NOTE — Therapy (Signed)
 OUTPATIENT PHYSICAL THERAPY LOWER EXTREMITY TREATMENT   Patient Name: Alejandra Greene MRN: [id number] DOB:30-Apr-1945, 78 y.o., female Today's Date: 08/13/2023  END OF SESSION:  PT End of Session - 08/13/23 1542     Visit Number 8    Number of Visits 12    Date for PT Re-Evaluation 09/02/23    PT Start Time 0315    PT Stop Time 0401    PT Time Calculation (min) 46 min    Activity Tolerance Patient tolerated treatment well    Behavior During Therapy WFL for tasks assessed/performed               Past Medical History:  Diagnosis Date   Arthritis    Cataract    beginnings    Complication of anesthesia    Fibromyalgia    GERD (gastroesophageal reflux disease)    Hypertension    pt states she has no high BP but on meds    IBS (irritable bowel syndrome)    Osteoporosis    PONV (postoperative nausea and vomiting)    Sleep apnea    CPAP at night   Past Surgical History:  Procedure Laterality Date   ABDOMINAL HYSTERECTOMY     CARPAL TUNNEL RELEASE Right    COLONOSCOPY  2017   KNEE ARTHROSCOPY Left    PLANTAR FASCIA SURGERY     REPLACEMENT TOTAL KNEE Right    REVERSE SHOULDER ARTHROPLASTY Right 01/16/2017   ROTATOR CUFF REPAIR Right    TOTAL SHOULDER ARTHROPLASTY Right 01/16/2017   Procedure: REVERSE TOTAL SHOULDER ARTHROPLASTY;  Surgeon: Micheline Ahr, MD;  Location: [facility] OR;  Service: Orthopedics;  Laterality: Right;   Patient Active Problem List   Diagnosis Date Noted   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    SUBJECTIVE:   SUBJECTIVE STATEMENT: Not hurting.  Sore.  PERTINENT HISTORY: Right  total knee replacement. PAIN:  Are you having pain? Yes: NPRS scale: 0/10. Pain location: Left knee Pain description: Sore, numb. Aggravating factors: See above. Relieving factors: See above.    PRECAUTIONS: Other: No ultrasound.    RED FLAGS: None   WEIGHT BEARING RESTRICTIONS: No  FALLS:  Has patient fallen in last 6 months? No  LIVING ENVIRONMENT: Lives in: House/apartment Stairs:  Ramp. Has following equipment at home: [name] - 2 wheeled  OCCUPATION: Retired.  PLOF: Independent  PATIENT GOALS: Do ADL's without left knee pain.      OBJECTIVE:  Note: Objective measures were completed at Evaluation unless otherwise noted.  PATIENT SURVEYS:  LEFS 21/80  EDEMA:  Circumferential: Left 8 cms > right.  PALPATION: Diffuse anterior knee pain (mild).  LOWER EXTREMITY ROM:  Full left knee extension is supine and flexion to 90 degrees.  08/08/23:  Active flexion 105 degrees and passive to 110 degrees.  LOWER EXTREMITY MMT:  Patient able to perform an antigravity left LAQ and a SLR with min- assist.    GAIT: The patient is walking safely with normal cadence with a FWW.  TREATMENT DATE:   08/13/23:  Nustep level 3 x 10 minutes f/b recumbent bike x 10 minutes progressing to seat 3 f/b knee ext 10# x 3 minutes  and ham curls at 30# x 3 minutes f/b LE elevation and vasopneumatic on low.    08/08/23:                                     EXERCISE LOG  Exercise Repetitions and Resistance Comments  Nustep Level 3 x 16 minutes moving seat forward x 2 to increase flexion.   Rockerboard In parallel bars x 3 minutes   Box lunges 14 inch x 2 minutes   LAQ's 4# x 3 minutes.       In supine:  Low load long duration stretching x 3 minutes  f/b vasopneumatic on low with LE elevation x 20 minutes.    08/06/23:                                      EXERCISE LOG  Exercise Repetitions and Resistance Comments  Nustep Level 3 x 15 minutes   Rockerboard In parallel bars x 3 minutes   Step-ups 6 inch x 2 minutes   Box lunges 14 inch x 4 minutes    SAQ's 4# x 4 minutes    f/b vasopneumatic on low with LE elevation x 20 minutes.        PATIENT EDUCATION:  Education details: Reviewed HEP. Person educated: Patient Education method: Medical illustrator Education comprehension: verbalized understanding and returned demonstration  HOME EXERCISE PROGRAM: As above.  ASSESSMENT:  CLINICAL IMPRESSION: Patient did very well with the addition of the recumbent bike and weight machines.  She performed with excellent technique and no complaints.    OBJECTIVE IMPAIRMENTS: decreased activity tolerance, decreased ROM, decreased strength, increased edema, and pain.   ACTIVITY LIMITATIONS: lifting  PARTICIPATION LIMITATIONS: meal prep, cleaning, and laundry  REHAB POTENTIAL: Excellent  CLINICAL DECISION MAKING: Stable/uncomplicated  EVALUATION COMPLEXITY: Low   GOALS: LONG TERM GOALS: Target date: 09/02/23  Ind with a HEP.  Goal status: MET  2.  Active knee flexion to 115 degrees+ so the patient can perform functional tasks and do so with pain not > 2-3/10.  Goal status: 08/08/23:  Active flexion 105 degrees and passive to 110 degrees.  3.  Increase hip and knee strength to 5/5 to provide good stability for accomplishment of functional activities.  Goal status: In progress.  4.  Decrease edema to within 3 cms of non-affected side to assist with pain reduction and range of motion gains.  Goal status: In progress.  5.  Perform a reciprocating stair gait with one railing with pain not > 2-3/10.  Goal status: In progress.  6.  Perform ADL's with pain not > 2-3/10.  Goal status: In progress.    PLAN:  PT FREQUENCY: 2x/week  PT DURATION: 6 weeks  PLANNED INTERVENTIONS: 97110-Therapeutic exercises, 97530- Therapeutic  activity, [id number]- Neuromuscular re-education, 97535- Self Care, [id number]- Manual therapy, G0283- Electrical stimulation (unattended), 97016- Vasopneumatic device, Patient/Family education, and Cryotherapy  PLAN FOR NEXT SESSION: Progress into total knee replacement protocol.  Vasopneumatic and elevation.   Lorie Melichar, [place], PT 08/13/2023, 4:04 PM

## 2023-08-15 ENCOUNTER — Ambulatory Visit

## 2023-08-15 DIAGNOSIS — R600 Localized edema: Secondary | ICD-10-CM

## 2023-08-15 DIAGNOSIS — M25562 Pain in left knee: Secondary | ICD-10-CM | POA: Diagnosis not present

## 2023-08-15 DIAGNOSIS — G8929 Other chronic pain: Secondary | ICD-10-CM

## 2023-08-15 DIAGNOSIS — M6281 Muscle weakness (generalized): Secondary | ICD-10-CM

## 2023-08-15 DIAGNOSIS — M25662 Stiffness of left knee, not elsewhere classified: Secondary | ICD-10-CM

## 2023-08-15 NOTE — Therapy (Signed)
 OUTPATIENT PHYSICAL THERAPY LOWER EXTREMITY TREATMENT   Patient Name: Alejandra Greene MRN: [id number] DOB:1945-04-30, 78 y.o., female Today's Date: 08/15/2023  END OF SESSION:  PT End of Session - 08/15/23 0935     Visit Number 8   count corrected today by this PTA   Number of Visits 12    Date for PT Re-Evaluation 09/02/23    PT Start Time 0930    PT Stop Time 1030    PT Time Calculation (min) 60 min    Activity Tolerance Patient tolerated treatment well    Behavior During Therapy WFL for tasks assessed/performed               Past Medical History:  Diagnosis Date   Arthritis    Cataract    beginnings    Complication of anesthesia    Fibromyalgia    GERD (gastroesophageal reflux disease)    Hypertension    pt states she has no high BP but on meds    IBS (irritable bowel syndrome)    Osteoporosis    PONV (postoperative nausea and vomiting)    Sleep apnea    CPAP at night   Past Surgical History:  Procedure Laterality Date   ABDOMINAL HYSTERECTOMY     CARPAL TUNNEL RELEASE Right    COLONOSCOPY  2017   KNEE ARTHROSCOPY Left    PLANTAR FASCIA SURGERY     REPLACEMENT TOTAL KNEE Right    REVERSE SHOULDER ARTHROPLASTY Right 01/16/2017   ROTATOR CUFF REPAIR Right    TOTAL SHOULDER ARTHROPLASTY Right 01/16/2017   Procedure: REVERSE TOTAL SHOULDER ARTHROPLASTY;  Surgeon: Micheline Ahr, MD;  Location: [facility] OR;  Service: Orthopedics;  Laterality: Right;   Patient Active Problem List   Diagnosis Date Noted   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    SUBJECTIVE:   SUBJECTIVE STATEMENT: Pt reports  3/10 left knee pain today.   PERTINENT HISTORY: Right total knee replacement. PAIN:  Are you having pain? Yes: NPRS scale: 3/10. Pain location: Left knee Pain description: Sore, numb. Aggravating factors: See above. Relieving factors: See above.    PRECAUTIONS: Other: No ultrasound.    RED FLAGS: None   WEIGHT BEARING RESTRICTIONS: No  FALLS:  Has patient fallen in last 6 months? No  LIVING ENVIRONMENT: Lives in: House/apartment Stairs: Ramp. Has following equipment at home: [name] - 2 wheeled  OCCUPATION: Retired.  PLOF: Independent  PATIENT GOALS: Do ADL's without left knee pain.      OBJECTIVE:  Note: Objective measures were completed at Evaluation unless otherwise noted.  PATIENT SURVEYS:  LEFS 21/80  EDEMA:  Circumferential: Left 8 cms > right.  PALPATION: Diffuse anterior knee pain (mild).  LOWER EXTREMITY ROM:  Full left knee extension is supine and flexion to 90 degrees.  08/08/23:  Active flexion 105 degrees and passive to 110 degrees.  LOWER EXTREMITY MMT:  Patient able to perform an antigravity left LAQ and a SLR with min- assist.    GAIT: The patient is walking safely with normal cadence with a FWW.  TREATMENT DATE:     08/15/23                                 EXERCISE LOG  Exercise Repetitions and Resistance Comments  Nustep Lvl 3 x 10 mins   Recumbent Bike Lvl 2 x 10 mins   Rockerboard 5 mins   Lunges 14" box x 4 mins   Cybex Knee Flexion 30# x 2.5 mins   Cybex Knee Extension 10# x 2.5 mins    Blank cell = exercise not performed today  Modalities  Date:  Vaso: Knee, 34 degrees; low pressure, 15 mins, Pain and Tone     08/13/23:  Nustep level 3 x 10 minutes f/b recumbent bike x 10 minutes progressing to seat 3 f/b knee ext 10# x 3 minutes  and ham curls at 30# x 3 minutes f/b LE elevation and  vasopneumatic on low.    08/08/23:                                     EXERCISE LOG  Exercise Repetitions and Resistance Comments  Nustep Level 3 x 16 minutes moving seat forward x 2 to increase flexion.   Rockerboard In parallel bars x 3 minutes   Box lunges 14 inch x 2 minutes   LAQ's 4# x 3 minutes.       In supine:  Low load long duration stretching x 3 minutes  f/b vasopneumatic on low with LE elevation x 20 minutes.    PATIENT EDUCATION:  Education details: Reviewed HEP. Person educated: Patient Education method: Medical illustrator Education comprehension: verbalized understanding and returned demonstration  HOME EXERCISE PROGRAM: As above.  ASSESSMENT:  CLINICAL IMPRESSION: Pt arrives for today's treatment session reporting 3/10 left knee soreness.  Pt reports increased soreness after last treatment session.  Due to increased soreness after last session, time on cybex machinery decreased today.  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  Pt states she was able to get in the truck yesterday with minimal issue.  Normal responses to vaso noted upon removal.  Pt reported decreased pain at completion of today's treatment session.  OBJECTIVE IMPAIRMENTS: decreased activity tolerance, decreased ROM, decreased strength, increased edema, and pain.   ACTIVITY LIMITATIONS: lifting  PARTICIPATION LIMITATIONS: meal prep, cleaning, and laundry  REHAB POTENTIAL: Excellent  CLINICAL DECISION MAKING: Stable/uncomplicated  EVALUATION COMPLEXITY: Low   GOALS: LONG TERM GOALS: Target date: 09/02/23  Ind with a HEP.  Goal status: MET  2.  Active knee flexion to 115 degrees+ so the patient can perform functional tasks and do so with pain not > 2-3/10.  Goal status: 08/08/23:  Active flexion 105 degrees and passive to 110 degrees.  3.  Increase hip and knee strength to 5/5 to provide good stability for accomplishment of  functional activities.  Goal status: In progress.  4.  Decrease edema to within 3 cms of non-affected side to assist with pain reduction and range of motion gains.  Goal status: In progress.  5.  Perform a reciprocating stair gait with one railing with pain not > 2-3/10.  Goal status: In progress.  6.  Perform ADL's with pain not > 2-3/10.  Goal status: In progress.   PLAN:  PT FREQUENCY: 2x/week  PT DURATION: 6 weeks  PLANNED INTERVENTIONS:  97110-Therapeutic exercises, 97530- Therapeutic activity, [id number]- Neuromuscular re-education, 97535- Self Care, 16109- Manual therapy, G0283- Electrical stimulation (unattended), 97016- Vasopneumatic device, Patient/Family education, and Cryotherapy  PLAN FOR NEXT SESSION: Progress into total knee replacement protocol.  Vasopneumatic and elevation.  Deryl Flora, PTA 08/15/2023, 11:20 AM

## 2023-08-20 ENCOUNTER — Ambulatory Visit: Admitting: Physical Therapy

## 2023-08-20 DIAGNOSIS — M25662 Stiffness of left knee, not elsewhere classified: Secondary | ICD-10-CM

## 2023-08-20 DIAGNOSIS — G8929 Other chronic pain: Secondary | ICD-10-CM

## 2023-08-20 DIAGNOSIS — R600 Localized edema: Secondary | ICD-10-CM

## 2023-08-20 DIAGNOSIS — M25562 Pain in left knee: Secondary | ICD-10-CM | POA: Diagnosis not present

## 2023-08-20 NOTE — Therapy (Signed)
 OUTPATIENT PHYSICAL THERAPY LOWER EXTREMITY TREATMENT   Patient Name: Alejandra Greene MRN: [id number] DOB:Jun 29, 1945, 78 y.o., female Today's Date: 08/20/2023  END OF SESSION:  PT End of Session - 08/20/23 1514     Visit Number 9    Number of Visits 12    Date for PT Re-Evaluation 09/02/23    PT Start Time 0311    PT Stop Time 0409    PT Time Calculation (min) 58 min    Activity Tolerance Patient tolerated treatment well    Behavior During Therapy WFL for tasks assessed/performed               Past Medical History:  Diagnosis Date   Arthritis    Cataract    beginnings    Complication of anesthesia    Fibromyalgia    GERD (gastroesophageal reflux disease)    Hypertension    pt states she has no high BP but on meds    IBS (irritable bowel syndrome)    Osteoporosis    PONV (postoperative nausea and vomiting)    Sleep apnea    CPAP at night   Past Surgical History:  Procedure Laterality Date   ABDOMINAL HYSTERECTOMY     CARPAL TUNNEL RELEASE Right    COLONOSCOPY  2017   KNEE ARTHROSCOPY Left    PLANTAR FASCIA SURGERY     REPLACEMENT TOTAL KNEE Right    REVERSE SHOULDER ARTHROPLASTY Right 01/16/2017   ROTATOR CUFF REPAIR Right    TOTAL SHOULDER ARTHROPLASTY Right 01/16/2017   Procedure: REVERSE TOTAL SHOULDER ARTHROPLASTY;  Surgeon: Micheline Ahr, MD;  Location: [facility];  Service: Orthopedics;  Laterality: Right;   Patient Active Problem List   Diagnosis Date Noted   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    SUBJECTIVE:   SUBJECTIVE STATEMENT: Pian low at rest. PERTINENT HISTORY: Right total knee replacement. PAIN:  Are  you having pain? Yes: NPRS scale: See above./10. Pain location: Left knee Pain description: Sore, numb. Aggravating factors: See above. Relieving factors: See above.    PRECAUTIONS: Other: No ultrasound.    RED FLAGS: None   WEIGHT BEARING RESTRICTIONS: No  FALLS:  Has patient fallen in last 6 months? No  LIVING ENVIRONMENT: Lives in: House/apartment Stairs: Ramp. Has following equipment at home: [name] - 2 wheeled  OCCUPATION: Retired.  PLOF: Independent  PATIENT GOALS: Do ADL's without left knee pain.      OBJECTIVE:  Note: Objective measures were completed at Evaluation unless otherwise noted.  PATIENT SURVEYS:  LEFS 21/80  EDEMA:  Circumferential: Left 8 cms > right.  PALPATION: Diffuse anterior knee pain (mild).  LOWER EXTREMITY ROM:  Full left knee extension is supine and flexion to 90 degrees.  08/08/23:  Active flexion 105 degrees and passive to 110 degrees.  LOWER EXTREMITY MMT:  Patient able to perform an antigravity left LAQ and a SLR with min- assist.    GAIT: The patient is walking safely with normal cadence with a FWW.  TREATMENT DATE:   08/20/23:                                       EXERCISE LOG  Exercise Repetitions and Resistance Comments  Recumbent bike Level 3 starting at seat 3 and finishing at seat 2 over 16 minutes   Rockerboard  In parallel bars x 4 minutes   Lunges 14 inch x 4 minutes   Knee ext 10# x 3 minutes   Ham curls 30# x 3 minutes    In supine;  LLLDS x 8 minutes into left knee flexion  f/b LE elevation and vasopneumatic x 15 minutes to patient's left knee.      08/15/23                                 EXERCISE LOG  Exercise Repetitions and Resistance Comments  Nustep Lvl 3 x 10 mins   Recumbent Bike Lvl 2 x 10 mins   Rockerboard 5 mins   Lunges 14" box x 4 mins   Cybex Knee Flexion  30# x 2.5 mins   Cybex Knee Extension 10# x 2.5 mins    Blank cell = exercise not performed today  Modalities  Date:  Vaso: Knee, 34 degrees; low pressure, 15 mins, Pain and Tone     08/13/23:  Nustep level 3 x 10 minutes f/b recumbent bike x 10 minutes progressing to seat 3 f/b knee ext 10# x 3 minutes  and ham curls at 30# x 3 minutes f/b LE elevation and vasopneumatic on low.    08/08/23:                                     EXERCISE LOG  Exercise Repetitions and Resistance Comments  Nustep Level 3 x 16 minutes moving seat forward x 2 to increase flexion.   Rockerboard In parallel bars x 3 minutes   Box lunges 14 inch x 2 minutes   LAQ's 4# x 3 minutes.       In supine:  Low load long duration stretching x 3 minutes  f/b vasopneumatic on low with LE elevation x 20 minutes.    PATIENT EDUCATION:  Education details: Reviewed HEP. Person educated: Patient Education method: Medical illustrator Education comprehension: verbalized understanding and returned demonstration  HOME EXERCISE PROGRAM: As above.  ASSESSMENT:  CLINICAL IMPRESSION: Pt arrives for today's treatment session reporting 3/10 left knee soreness.  Pt reports increased soreness after last treatment session.  Due to increased soreness after last session, time on cybex machinery decreased today.  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  Pt states she was able to get in the truck yesterday with minimal issue.  Normal responses to vaso noted upon removal.  Pt reported decreased pain at completion of today's treatment session.  OBJECTIVE IMPAIRMENTS: decreased activity tolerance, decreased ROM, decreased strength, increased edema, and pain.   ACTIVITY LIMITATIONS: lifting  PARTICIPATION LIMITATIONS: meal prep, cleaning, and laundry  REHAB POTENTIAL: Excellent  CLINICAL DECISION MAKING: Stable/uncomplicated  EVALUATION COMPLEXITY: Low   GOALS: LONG  TERM GOALS: Target date: 09/02/23  Ind with a HEP.  Goal status: MET  2.  Active knee flexion to 115 degrees+ so the patient  can perform functional tasks and do so with pain not > 2-3/10.  Goal status: 08/08/23:  Active flexion 105 degrees and passive to 110 degrees.  3.  Increase hip and knee strength to 5/5 to provide good stability for accomplishment of functional activities.  Goal status: In progress.  4.  Decrease edema to within 3 cms of non-affected side to assist with pain reduction and range of motion gains.  Goal status: In progress.  5.  Perform a reciprocating stair gait with one railing with pain not > 2-3/10.  Goal status: In progress.  6.  Perform ADL's with pain not > 2-3/10.  Goal status: In progress.   PLAN:  PT FREQUENCY: 2x/week  PT DURATION: 6 weeks  PLANNED INTERVENTIONS: 97110-Therapeutic exercises, 97530- Therapeutic activity, [id number]- Neuromuscular re-education, 97535- Self Care, [id number]- Manual therapy, G0283- Electrical stimulation (unattended), 97016- Vasopneumatic device, Patient/Family education, and Cryotherapy  PLAN FOR NEXT SESSION: Progress into total knee replacement protocol.  Vasopneumatic and elevation.  Quenton Recendez, [place], PT 08/20/2023, 4:41 PM

## 2023-08-22 ENCOUNTER — Ambulatory Visit: Admitting: Physical Therapy

## 2023-08-22 DIAGNOSIS — M25662 Stiffness of left knee, not elsewhere classified: Secondary | ICD-10-CM

## 2023-08-22 DIAGNOSIS — G8929 Other chronic pain: Secondary | ICD-10-CM

## 2023-08-22 DIAGNOSIS — R600 Localized edema: Secondary | ICD-10-CM

## 2023-08-22 DIAGNOSIS — M6281 Muscle weakness (generalized): Secondary | ICD-10-CM

## 2023-08-22 DIAGNOSIS — M25562 Pain in left knee: Secondary | ICD-10-CM | POA: Diagnosis not present

## 2023-08-22 NOTE — Therapy (Addendum)
 OUTPATIENT PHYSICAL THERAPY LOWER EXTREMITY TREATMENT   Patient Name: Alejandra Greene MRN: [id number] DOB:02-09-1946, 78 y.o., female Today's Date: 08/22/2023  END OF SESSION:  PT End of Session - 08/22/23 1530     Visit Number 10    Number of Visits 12    Date for PT Re-Evaluation 09/02/23    PT Start Time 0315    PT Stop Time 0410    PT Time Calculation (min) 55 min    Activity Tolerance Patient tolerated treatment well    Behavior During Therapy WFL for tasks assessed/performed               Past Medical History:  Diagnosis Date   Arthritis    Cataract    beginnings    Complication of anesthesia    Fibromyalgia    GERD (gastroesophageal reflux disease)    Hypertension    pt states she has no high BP but on meds    IBS (irritable bowel syndrome)    Osteoporosis    PONV (postoperative nausea and vomiting)    Sleep apnea    CPAP at night   Past Surgical History:  Procedure Laterality Date   ABDOMINAL HYSTERECTOMY     CARPAL TUNNEL RELEASE Right    COLONOSCOPY  2017   KNEE ARTHROSCOPY Left    PLANTAR FASCIA SURGERY     REPLACEMENT TOTAL KNEE Right    REVERSE SHOULDER ARTHROPLASTY Right 01/16/2017   ROTATOR CUFF REPAIR Right    TOTAL SHOULDER ARTHROPLASTY Right 01/16/2017   Procedure: REVERSE TOTAL SHOULDER ARTHROPLASTY;  Surgeon: Micheline Ahr, MD;  Location: [facility];  Service: Orthopedics;  Laterality: Right;   Patient Active Problem List   Diagnosis Date Noted   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    SUBJECTIVE:   SUBJECTIVE STATEMENT: Pain remain low. PERTINENT HISTORY: Right  total knee replacement. PAIN:  Are you having pain? Yes: NPRS scale: See above./10. Pain location: Left knee Pain description: Sore, numb. Aggravating factors: See above. Relieving factors: See above.    PRECAUTIONS: Other: No ultrasound.    RED FLAGS: None   WEIGHT BEARING RESTRICTIONS: No  FALLS:  Has patient fallen in last 6 months? No  LIVING ENVIRONMENT: Lives in: House/apartment Stairs: Ramp. Has following equipment at home: [name] - 2 wheeled  OCCUPATION: Retired.  PLOF: Independent  PATIENT GOALS: Do ADL's without left knee pain.      OBJECTIVE:  Note: Objective measures were completed at Evaluation unless otherwise noted.  PATIENT SURVEYS:  LEFS 21/80  EDEMA:  Circumferential: Left 8 cms > right.  PALPATION: Diffuse anterior knee pain (mild).  LOWER EXTREMITY ROM:  Full left knee extension is supine and flexion to 90 degrees.  08/08/23:  Active flexion 105 degrees and passive to 110 degrees.  08/22/23:  Active right knee flexion to 120 degrees. LOWER EXTREMITY MMT:  Patient able to perform an antigravity left LAQ and a SLR with min- assist.    GAIT: The patient is walking safely with normal cadence with a FWW.  TREATMENT DATE:   08/22/23:                                     EXERCISE LOG  Exercise Repetitions and Resistance Comments  Recumbent bike Level 3 progressing to seat 1 x 15 minutes   Knee ext 10# x 3 minutes   Hams curls 30# x 3 minutes   Leg press 2 plates x 3 minutes       PROM x 4 minutes into left knee flexion f/b LE elevation to patient's left knee x 20 minutes.    08/20/23:                                       EXERCISE LOG  Exercise Repetitions and Resistance Comments  Recumbent bike Level 3 starting at seat 3 and finishing at seat 2 over 16 minutes   Rockerboard  In parallel bars x 4  minutes   Lunges 14 inch x 4 minutes   Knee ext 10# x 3 minutes   Ham curls 30# x 3 minutes    In supine;  LLLDS x 8 minutes into left knee flexion  f/b LE elevation and vasopneumatic x 15 minutes to patient's left knee.      08/15/23                                 EXERCISE LOG  Exercise Repetitions and Resistance Comments  Nustep Lvl 3 x 10 mins   Recumbent Bike Lvl 2 x 10 mins   Rockerboard 5 mins   Lunges 14" box x 4 mins   Cybex Knee Flexion 30# x 2.5 mins   Cybex Knee Extension 10# x 2.5 mins    Blank cell = exercise not performed today  Modalities  Date:  Vaso: Knee, 34 degrees; low pressure, 15 mins, Pain and Tone     08/13/23:  Nustep level 3 x 10 minutes f/b recumbent bike x 10 minutes progressing to seat 3 f/b knee ext 10# x 3 minutes  and ham curls at 30# x 3 minutes f/b LE elevation and vasopneumatic on low.       PATIENT EDUCATION:  Education details: Reviewed HEP. Person educated: Patient Education method: Medical illustrator Education comprehension: verbalized understanding and returned demonstration  HOME EXERCISE PROGRAM: As above.  ASSESSMENT:  CLINICAL IMPRESSION: Patient is doing very well.  Added leg press today which patient performed without complaint and excellent technique.  She achieved active left knee flexion to 120 degrees.    OBJECTIVE IMPAIRMENTS: decreased activity tolerance, decreased ROM, decreased strength, increased edema, and pain.   ACTIVITY LIMITATIONS: lifting  PARTICIPATION LIMITATIONS: meal prep, cleaning, and laundry  REHAB POTENTIAL: Excellent  CLINICAL DECISION MAKING: Stable/uncomplicated  EVALUATION COMPLEXITY: Low   GOALS: LONG TERM GOALS: Target date: 09/02/23  Ind with a HEP.  Goal status: MET  2.  Active knee flexion to 115 degrees+ so the patient can perform functional tasks and do so with pain not > 2-3/10.  Goal status: 08/21/23:  Active flexion 120 degrees.    3.  Increase hip and knee  strength to 5/5 to provide good stability for accomplishment of functional activities.  Goal status: MET.  4.  Decrease edema to within 3 cms of non-affected  side to assist with pain reduction and range of motion gains.  Goal status: In progress.  5.  Perform a reciprocating stair gait with one railing with pain not > 2-3/10.  Goal status: In progress.  6.  Perform ADL's with pain not > 2-3/10.  Goal status: In progress.   PLAN:  PT FREQUENCY: 2x/week  PT DURATION: 6 weeks  PLANNED INTERVENTIONS: 97110-Therapeutic exercises, 97530- Therapeutic activity, [id number]- Neuromuscular re-education, 97535- Self Care, 47829- Manual therapy, G0283- Electrical stimulation (unattended), 97016- Vasopneumatic device, Patient/Family education, and Cryotherapy  PLAN FOR NEXT SESSION: Progress into total knee replacement protocol.  Vasopneumatic and elevation.  Progress Note Reporting Period 07/22/23 to 08/22/23:    See note below for Objective Data and Assessment of Progress/Goals. Good progression toward goals.  See above.     Irbin Fines, [place], PT 08/22/2023, 4:17 PM

## 2024-03-16 ENCOUNTER — Encounter: Payer: Self-pay | Admitting: Podiatry

## 2024-03-16 ENCOUNTER — Ambulatory Visit (INDEPENDENT_AMBULATORY_CARE_PROVIDER_SITE_OTHER): Admitting: Podiatry

## 2024-03-16 VITALS — Ht 66.0 in | Wt 192.9 lb

## 2024-03-16 DIAGNOSIS — M79675 Pain in left toe(s): Secondary | ICD-10-CM | POA: Diagnosis not present

## 2024-03-16 DIAGNOSIS — B351 Tinea unguium: Secondary | ICD-10-CM

## 2024-03-16 DIAGNOSIS — M79674 Pain in right toe(s): Secondary | ICD-10-CM | POA: Diagnosis not present

## 2024-03-16 NOTE — Progress Notes (Signed)
   Chief Complaint  Patient presents with   Nail Problem    Pt is here due to bilateral toenail fungus.    SUBJECTIVE Patient presents to office today complaining of elongated, thickened nails that cause pain while ambulating in shoes.  Patient is unable to trim their own nails. Patient is here for further evaluation and treatment.  Past Medical History:  Diagnosis Date   Arthritis    Cataract    beginnings    Complication of anesthesia    Fibromyalgia    GERD (gastroesophageal reflux disease)    Hypertension    pt states she has no high BP but on meds    IBS (irritable bowel syndrome)    Osteoporosis    PONV (postoperative nausea and vomiting)    Sleep apnea    CPAP at night    Allergies  Allergen Reactions   Codeine Nausea And Vomiting   Cymbalta [Duloxetine Hcl] Swelling    Swelling in hands and feet   Pregabalin Swelling    Hands and feet swell   Nitrofuran Derivatives Nausea Only     OBJECTIVE General Patient is awake, alert, and oriented x 3 and in no acute distress. Derm Skin is dry and supple bilateral. Negative open lesions or macerations. Remaining integument unremarkable. Nails are tender, long, thickened and dystrophic with subungual debris, consistent with onychomycosis, 1-5 bilateral. No signs of infection noted. Vasc  DP and PT pedal pulses palpable bilaterally. Temperature gradient within normal limits.  Neuro Epicritic and protective threshold sensation grossly intact bilaterally.  Musculoskeletal Exam No symptomatic pedal deformities noted bilateral. Muscular strength within normal limits.  ASSESSMENT 1.  Pain due to onychomycosis of toenails both  PLAN OF CARE 1. Patient evaluated today.  2. Instructed to maintain good pedal hygiene and foot care.  3. Mechanical debridement of nails 1-5 bilaterally performed using a nail nipper. Filed with dremel without incident.  4. Return to clinic in 3 mos.    Thresa EMERSON Sar, DPM [facility]  Dr. Thresa EMERSON Sar, DPM    [address] [place], [place] [address]                Office [phone]  Fax [phone]

## 2024-06-15 ENCOUNTER — Ambulatory Visit: Admitting: Podiatry
